# Patient Record
Sex: Female | Born: 1950
Health system: Southern US, Community
[De-identification: ages and names within clinical notes are randomized; demographics above are authoritative.]

## PROBLEM LIST (undated history)

## (undated) DIAGNOSIS — M1990 Unspecified osteoarthritis, unspecified site: Secondary | ICD-10-CM

## (undated) DIAGNOSIS — R195 Other fecal abnormalities: Secondary | ICD-10-CM

## (undated) DIAGNOSIS — E785 Hyperlipidemia, unspecified: Secondary | ICD-10-CM

## (undated) DIAGNOSIS — I10 Essential (primary) hypertension: Secondary | ICD-10-CM

## (undated) DIAGNOSIS — B019 Varicella without complication: Secondary | ICD-10-CM

## (undated) DIAGNOSIS — K625 Hemorrhage of anus and rectum: Secondary | ICD-10-CM

## (undated) DIAGNOSIS — R011 Cardiac murmur, unspecified: Secondary | ICD-10-CM

## (undated) HISTORY — DX: Unspecified osteoarthritis, unspecified site: M19.90

## (undated) HISTORY — DX: Hyperlipidemia, unspecified: E78.5

## (undated) HISTORY — DX: Essential (primary) hypertension: I10

## (undated) HISTORY — DX: Hemorrhage of anus and rectum: K62.5

## (undated) HISTORY — DX: Other fecal abnormalities: R19.5

## (undated) HISTORY — DX: Varicella without complication: B01.9

## (undated) HISTORY — DX: Cardiac murmur, unspecified: R01.1

## (undated) HISTORY — PX: TONSILLECTOMY AND ADENOIDECTOMY: SHX28

## (undated) HISTORY — PX: DG KNEE RIGHT COMPLETE (ARMC HX): HXRAD1559

## (undated) HISTORY — PX: BREAST SURGERY: SHX581

---

## 2015-10-07 LAB — BASIC METABOLIC PANEL
BUN: 12 mg/dL (ref 4–21)
CREATININE: 0.8 mg/dL (ref 0.5–1.1)
GLUCOSE: 150 mg/dL
POTASSIUM: 3.9 mmol/L (ref 3.4–5.3)
Sodium: 138 mmol/L (ref 137–147)

## 2015-10-07 LAB — CBC AND DIFFERENTIAL
HCT: 38 % (ref 36–46)
Hemoglobin: 11.8 g/dL — AB (ref 12.0–16.0)
PLATELETS: 314 10*3/uL (ref 150–399)
WBC: 13.5 10*3/mL

## 2015-10-07 LAB — LIPID PANEL
CHOLESTEROL: 201 mg/dL — AB (ref 0–200)
HDL: 79 mg/dL — AB (ref 35–70)
LDL Cholesterol: 102 mg/dL
Triglycerides: 98 mg/dL (ref 40–160)

## 2015-10-07 LAB — HEPATIC FUNCTION PANEL
ALK PHOS: 91 U/L (ref 25–125)
ALT: 12 U/L (ref 7–35)
AST: 10 U/L — AB (ref 13–35)
BILIRUBIN, TOTAL: 1 mg/dL

## 2015-10-07 LAB — HEMOGLOBIN A1C: HEMOGLOBIN A1C: 5.9

## 2015-10-07 LAB — TSH: TSH: 1.98 u[IU]/mL (ref 0.41–5.90)

## 2015-11-01 DIAGNOSIS — H25813 Combined forms of age-related cataract, bilateral: Secondary | ICD-10-CM | POA: Diagnosis not present

## 2015-11-01 DIAGNOSIS — H43311 Vitreous membranes and strands, right eye: Secondary | ICD-10-CM | POA: Diagnosis not present

## 2016-03-14 DIAGNOSIS — M9905 Segmental and somatic dysfunction of pelvic region: Secondary | ICD-10-CM | POA: Diagnosis not present

## 2016-03-14 DIAGNOSIS — M9903 Segmental and somatic dysfunction of lumbar region: Secondary | ICD-10-CM | POA: Diagnosis not present

## 2016-03-14 DIAGNOSIS — M9902 Segmental and somatic dysfunction of thoracic region: Secondary | ICD-10-CM | POA: Diagnosis not present

## 2016-03-14 DIAGNOSIS — M5136 Other intervertebral disc degeneration, lumbar region: Secondary | ICD-10-CM | POA: Diagnosis not present

## 2016-03-14 DIAGNOSIS — M5134 Other intervertebral disc degeneration, thoracic region: Secondary | ICD-10-CM | POA: Diagnosis not present

## 2016-03-15 DIAGNOSIS — M9905 Segmental and somatic dysfunction of pelvic region: Secondary | ICD-10-CM | POA: Diagnosis not present

## 2016-03-15 DIAGNOSIS — M9903 Segmental and somatic dysfunction of lumbar region: Secondary | ICD-10-CM | POA: Diagnosis not present

## 2016-03-15 DIAGNOSIS — M5136 Other intervertebral disc degeneration, lumbar region: Secondary | ICD-10-CM | POA: Diagnosis not present

## 2016-03-15 DIAGNOSIS — M9902 Segmental and somatic dysfunction of thoracic region: Secondary | ICD-10-CM | POA: Diagnosis not present

## 2016-03-15 DIAGNOSIS — M5134 Other intervertebral disc degeneration, thoracic region: Secondary | ICD-10-CM | POA: Diagnosis not present

## 2016-03-16 DIAGNOSIS — M9905 Segmental and somatic dysfunction of pelvic region: Secondary | ICD-10-CM | POA: Diagnosis not present

## 2016-03-16 DIAGNOSIS — M5134 Other intervertebral disc degeneration, thoracic region: Secondary | ICD-10-CM | POA: Diagnosis not present

## 2016-03-16 DIAGNOSIS — M9902 Segmental and somatic dysfunction of thoracic region: Secondary | ICD-10-CM | POA: Diagnosis not present

## 2016-03-16 DIAGNOSIS — M9903 Segmental and somatic dysfunction of lumbar region: Secondary | ICD-10-CM | POA: Diagnosis not present

## 2016-03-16 DIAGNOSIS — M5136 Other intervertebral disc degeneration, lumbar region: Secondary | ICD-10-CM | POA: Diagnosis not present

## 2016-03-20 DIAGNOSIS — M9903 Segmental and somatic dysfunction of lumbar region: Secondary | ICD-10-CM | POA: Diagnosis not present

## 2016-03-20 DIAGNOSIS — M9902 Segmental and somatic dysfunction of thoracic region: Secondary | ICD-10-CM | POA: Diagnosis not present

## 2016-03-20 DIAGNOSIS — M9905 Segmental and somatic dysfunction of pelvic region: Secondary | ICD-10-CM | POA: Diagnosis not present

## 2016-03-20 DIAGNOSIS — M5134 Other intervertebral disc degeneration, thoracic region: Secondary | ICD-10-CM | POA: Diagnosis not present

## 2016-03-20 DIAGNOSIS — M5136 Other intervertebral disc degeneration, lumbar region: Secondary | ICD-10-CM | POA: Diagnosis not present

## 2016-03-21 DIAGNOSIS — M9902 Segmental and somatic dysfunction of thoracic region: Secondary | ICD-10-CM | POA: Diagnosis not present

## 2016-03-21 DIAGNOSIS — M5134 Other intervertebral disc degeneration, thoracic region: Secondary | ICD-10-CM | POA: Diagnosis not present

## 2016-03-21 DIAGNOSIS — M5136 Other intervertebral disc degeneration, lumbar region: Secondary | ICD-10-CM | POA: Diagnosis not present

## 2016-03-21 DIAGNOSIS — M9905 Segmental and somatic dysfunction of pelvic region: Secondary | ICD-10-CM | POA: Diagnosis not present

## 2016-03-21 DIAGNOSIS — M9903 Segmental and somatic dysfunction of lumbar region: Secondary | ICD-10-CM | POA: Diagnosis not present

## 2016-03-23 DIAGNOSIS — M9905 Segmental and somatic dysfunction of pelvic region: Secondary | ICD-10-CM | POA: Diagnosis not present

## 2016-03-23 DIAGNOSIS — M9903 Segmental and somatic dysfunction of lumbar region: Secondary | ICD-10-CM | POA: Diagnosis not present

## 2016-03-23 DIAGNOSIS — M5136 Other intervertebral disc degeneration, lumbar region: Secondary | ICD-10-CM | POA: Diagnosis not present

## 2016-03-23 DIAGNOSIS — M5134 Other intervertebral disc degeneration, thoracic region: Secondary | ICD-10-CM | POA: Diagnosis not present

## 2016-03-23 DIAGNOSIS — M9902 Segmental and somatic dysfunction of thoracic region: Secondary | ICD-10-CM | POA: Diagnosis not present

## 2016-03-27 DIAGNOSIS — M9905 Segmental and somatic dysfunction of pelvic region: Secondary | ICD-10-CM | POA: Diagnosis not present

## 2016-03-27 DIAGNOSIS — M9902 Segmental and somatic dysfunction of thoracic region: Secondary | ICD-10-CM | POA: Diagnosis not present

## 2016-03-27 DIAGNOSIS — M5136 Other intervertebral disc degeneration, lumbar region: Secondary | ICD-10-CM | POA: Diagnosis not present

## 2016-03-27 DIAGNOSIS — M5134 Other intervertebral disc degeneration, thoracic region: Secondary | ICD-10-CM | POA: Diagnosis not present

## 2016-03-27 DIAGNOSIS — M9903 Segmental and somatic dysfunction of lumbar region: Secondary | ICD-10-CM | POA: Diagnosis not present

## 2016-03-28 DIAGNOSIS — M5134 Other intervertebral disc degeneration, thoracic region: Secondary | ICD-10-CM | POA: Diagnosis not present

## 2016-03-28 DIAGNOSIS — M9905 Segmental and somatic dysfunction of pelvic region: Secondary | ICD-10-CM | POA: Diagnosis not present

## 2016-03-28 DIAGNOSIS — M5136 Other intervertebral disc degeneration, lumbar region: Secondary | ICD-10-CM | POA: Diagnosis not present

## 2016-03-28 DIAGNOSIS — M9903 Segmental and somatic dysfunction of lumbar region: Secondary | ICD-10-CM | POA: Diagnosis not present

## 2016-03-28 DIAGNOSIS — M9902 Segmental and somatic dysfunction of thoracic region: Secondary | ICD-10-CM | POA: Diagnosis not present

## 2016-03-30 DIAGNOSIS — M5134 Other intervertebral disc degeneration, thoracic region: Secondary | ICD-10-CM | POA: Diagnosis not present

## 2016-03-30 DIAGNOSIS — M9903 Segmental and somatic dysfunction of lumbar region: Secondary | ICD-10-CM | POA: Diagnosis not present

## 2016-03-30 DIAGNOSIS — M9902 Segmental and somatic dysfunction of thoracic region: Secondary | ICD-10-CM | POA: Diagnosis not present

## 2016-03-30 DIAGNOSIS — M5136 Other intervertebral disc degeneration, lumbar region: Secondary | ICD-10-CM | POA: Diagnosis not present

## 2016-03-30 DIAGNOSIS — M9905 Segmental and somatic dysfunction of pelvic region: Secondary | ICD-10-CM | POA: Diagnosis not present

## 2016-04-03 DIAGNOSIS — M9902 Segmental and somatic dysfunction of thoracic region: Secondary | ICD-10-CM | POA: Diagnosis not present

## 2016-04-03 DIAGNOSIS — M9905 Segmental and somatic dysfunction of pelvic region: Secondary | ICD-10-CM | POA: Diagnosis not present

## 2016-04-03 DIAGNOSIS — M5134 Other intervertebral disc degeneration, thoracic region: Secondary | ICD-10-CM | POA: Diagnosis not present

## 2016-04-03 DIAGNOSIS — M5136 Other intervertebral disc degeneration, lumbar region: Secondary | ICD-10-CM | POA: Diagnosis not present

## 2016-04-03 DIAGNOSIS — M9903 Segmental and somatic dysfunction of lumbar region: Secondary | ICD-10-CM | POA: Diagnosis not present

## 2016-04-04 DIAGNOSIS — M9902 Segmental and somatic dysfunction of thoracic region: Secondary | ICD-10-CM | POA: Diagnosis not present

## 2016-04-04 DIAGNOSIS — M9903 Segmental and somatic dysfunction of lumbar region: Secondary | ICD-10-CM | POA: Diagnosis not present

## 2016-04-04 DIAGNOSIS — M9905 Segmental and somatic dysfunction of pelvic region: Secondary | ICD-10-CM | POA: Diagnosis not present

## 2016-04-04 DIAGNOSIS — M5134 Other intervertebral disc degeneration, thoracic region: Secondary | ICD-10-CM | POA: Diagnosis not present

## 2016-04-04 DIAGNOSIS — M5136 Other intervertebral disc degeneration, lumbar region: Secondary | ICD-10-CM | POA: Diagnosis not present

## 2016-04-06 DIAGNOSIS — M5136 Other intervertebral disc degeneration, lumbar region: Secondary | ICD-10-CM | POA: Diagnosis not present

## 2016-04-06 DIAGNOSIS — M9903 Segmental and somatic dysfunction of lumbar region: Secondary | ICD-10-CM | POA: Diagnosis not present

## 2016-04-06 DIAGNOSIS — M9905 Segmental and somatic dysfunction of pelvic region: Secondary | ICD-10-CM | POA: Diagnosis not present

## 2016-04-06 DIAGNOSIS — M9902 Segmental and somatic dysfunction of thoracic region: Secondary | ICD-10-CM | POA: Diagnosis not present

## 2016-04-06 DIAGNOSIS — M5134 Other intervertebral disc degeneration, thoracic region: Secondary | ICD-10-CM | POA: Diagnosis not present

## 2016-04-11 DIAGNOSIS — M5136 Other intervertebral disc degeneration, lumbar region: Secondary | ICD-10-CM | POA: Diagnosis not present

## 2016-04-11 DIAGNOSIS — M9903 Segmental and somatic dysfunction of lumbar region: Secondary | ICD-10-CM | POA: Diagnosis not present

## 2016-04-11 DIAGNOSIS — M9905 Segmental and somatic dysfunction of pelvic region: Secondary | ICD-10-CM | POA: Diagnosis not present

## 2016-04-11 DIAGNOSIS — M5134 Other intervertebral disc degeneration, thoracic region: Secondary | ICD-10-CM | POA: Diagnosis not present

## 2016-04-11 DIAGNOSIS — M9902 Segmental and somatic dysfunction of thoracic region: Secondary | ICD-10-CM | POA: Diagnosis not present

## 2016-04-13 DIAGNOSIS — M5134 Other intervertebral disc degeneration, thoracic region: Secondary | ICD-10-CM | POA: Diagnosis not present

## 2016-04-13 DIAGNOSIS — M5136 Other intervertebral disc degeneration, lumbar region: Secondary | ICD-10-CM | POA: Diagnosis not present

## 2016-04-13 DIAGNOSIS — M9902 Segmental and somatic dysfunction of thoracic region: Secondary | ICD-10-CM | POA: Diagnosis not present

## 2016-04-13 DIAGNOSIS — M9903 Segmental and somatic dysfunction of lumbar region: Secondary | ICD-10-CM | POA: Diagnosis not present

## 2016-04-13 DIAGNOSIS — M9905 Segmental and somatic dysfunction of pelvic region: Secondary | ICD-10-CM | POA: Diagnosis not present

## 2016-04-19 DIAGNOSIS — M9902 Segmental and somatic dysfunction of thoracic region: Secondary | ICD-10-CM | POA: Diagnosis not present

## 2016-04-19 DIAGNOSIS — M5136 Other intervertebral disc degeneration, lumbar region: Secondary | ICD-10-CM | POA: Diagnosis not present

## 2016-04-19 DIAGNOSIS — M9905 Segmental and somatic dysfunction of pelvic region: Secondary | ICD-10-CM | POA: Diagnosis not present

## 2016-04-19 DIAGNOSIS — M5134 Other intervertebral disc degeneration, thoracic region: Secondary | ICD-10-CM | POA: Diagnosis not present

## 2016-04-19 DIAGNOSIS — M9903 Segmental and somatic dysfunction of lumbar region: Secondary | ICD-10-CM | POA: Diagnosis not present

## 2016-04-26 DIAGNOSIS — M5134 Other intervertebral disc degeneration, thoracic region: Secondary | ICD-10-CM | POA: Diagnosis not present

## 2016-04-26 DIAGNOSIS — M9903 Segmental and somatic dysfunction of lumbar region: Secondary | ICD-10-CM | POA: Diagnosis not present

## 2016-04-26 DIAGNOSIS — M9902 Segmental and somatic dysfunction of thoracic region: Secondary | ICD-10-CM | POA: Diagnosis not present

## 2016-04-26 DIAGNOSIS — M5136 Other intervertebral disc degeneration, lumbar region: Secondary | ICD-10-CM | POA: Diagnosis not present

## 2016-04-26 DIAGNOSIS — M9905 Segmental and somatic dysfunction of pelvic region: Secondary | ICD-10-CM | POA: Diagnosis not present

## 2016-05-02 ENCOUNTER — Ambulatory Visit (INDEPENDENT_AMBULATORY_CARE_PROVIDER_SITE_OTHER): Payer: Medicare Other | Admitting: Family Medicine

## 2016-05-02 ENCOUNTER — Encounter: Payer: Self-pay | Admitting: Family Medicine

## 2016-05-02 VITALS — BP 140/80 | HR 69 | Resp 12 | Ht 62.5 in | Wt 289.1 lb

## 2016-05-02 DIAGNOSIS — R739 Hyperglycemia, unspecified: Secondary | ICD-10-CM | POA: Diagnosis not present

## 2016-05-02 DIAGNOSIS — I10 Essential (primary) hypertension: Secondary | ICD-10-CM

## 2016-05-02 DIAGNOSIS — Z23 Encounter for immunization: Secondary | ICD-10-CM | POA: Diagnosis not present

## 2016-05-02 DIAGNOSIS — G4733 Obstructive sleep apnea (adult) (pediatric): Secondary | ICD-10-CM | POA: Diagnosis not present

## 2016-05-02 DIAGNOSIS — M170 Bilateral primary osteoarthritis of knee: Secondary | ICD-10-CM

## 2016-05-02 DIAGNOSIS — Z6841 Body Mass Index (BMI) 40.0 and over, adult: Secondary | ICD-10-CM

## 2016-05-02 HISTORY — DX: Essential (primary) hypertension: I10

## 2016-05-02 HISTORY — DX: Obstructive sleep apnea (adult) (pediatric): G47.33

## 2016-05-02 HISTORY — DX: Morbid (severe) obesity due to excess calories: E66.01

## 2016-05-02 HISTORY — DX: Body Mass Index (BMI) 40.0 and over, adult: Z684

## 2016-05-02 HISTORY — DX: Bilateral primary osteoarthritis of knee: M17.0

## 2016-05-02 LAB — BASIC METABOLIC PANEL
BUN: 15 mg/dL (ref 6–23)
CHLORIDE: 102 meq/L (ref 96–112)
CO2: 32 meq/L (ref 19–32)
Calcium: 9.3 mg/dL (ref 8.4–10.5)
Creatinine, Ser: 0.85 mg/dL (ref 0.40–1.20)
GFR: 86.2 mL/min (ref >60.00)
GLUCOSE: 120 mg/dL — AB (ref 70–99)
Potassium: 3.6 mEq/L (ref 3.5–5.1)
SODIUM: 140 meq/L (ref 135–145)

## 2016-05-02 LAB — HEMOGLOBIN A1C: HEMOGLOBIN A1C: 5.6 % (ref 4.6–6.5)

## 2016-05-02 MED ORDER — METOPROLOL SUCCINATE ER 25 MG PO TB24: 25.0000 mg | ORAL_TABLET | Freq: Every day | ORAL | 0 refills | Status: DC

## 2016-05-02 NOTE — Progress Notes (Signed)
Pre visit review using our clinic review tool, if applicable. No additional management support is needed unless otherwise documented below in the visit note. 

## 2016-05-02 NOTE — Patient Instructions (Addendum)
A few things to remember from today's visit:   Hyperglycemia - Plan: Hemoglobin A1c, Fructosamine, Basic Metabolic Panel  BMI 99991111, adult (HCC)  OSA (obstructive sleep apnea)  Essential hypertension, benign - Plan: Basic Metabolic Panel, metoprolol succinate (TOPROL-XL) 25 MG 24 hr tablet  Bilateral primary osteoarthritis of knee  Reconsider quitting CPAP at night.  Weight Watchers is a good option for a healthy diet. Continue low impact exercise.   Blood pressure goal for most people is less than 140/90.Some populations (older than 60) the goal is less than 150/90.  Elevated blood pressure increases the risk of strokes, heart and kidney disease, and eye problems. Regular physical activity and a healthy diet (DASH diet) usually help. Low salt diet. Take medications as instructed. Caution with some over the counter medications as cold medications, dietary products (for weight loss), and Ibuprofen or Aleve (frequent use), as well as Mobic;all these medications could cause elevation of blood pressure.   Avoid sugar added food:regular soft drinks, energy drinks, and sports drinks. candy. cakes. cookies. pies and cobblers. sweet rolls, pastries, and donuts. fruit drinks, such as fruitades and fruit punch. dairy desserts, such as ice cream  Mediterranean diet has showed benefits for sugar control.  How much and what type of carbohydrate foods are important for managing diabetes. The balance between how much insulin is in your body and the carbohydrate you eat makes a difference in your blood glucose levels.     Remember checking feet periodically, good dental hygiene, and annual eye exam.    Medicare covers a annual preventive visit, which is strongly recommended , it is once per year and involves a series of questions to identify risk factors; so we can try to prevent possible complications. This does not need to be done by a doctor.  We have a nurse Investment banker, corporate) here that is  highly qualified to do it, it can be arrange same date you have a follow up appointment with me or labs scheduled, and it 100% covered by Medicare. So before you leave today I would like for you to arrange visit with Ms Tina Villanueva in 10/2016 for Medicare wellness visit.   Please be sure medication list is accurate. If a new problem present, please set up appointment sooner than planned today.

## 2016-05-02 NOTE — Progress Notes (Signed)
HPI:   Tina Villanueva is a 65 y.o. female, who is here today to establish care with me.  Former PCP: Dr Fransisco Hertz. Last preventive routine visit: 09/2015  Hx of knee OA, s/p right TKR.  HTN,HLD, OSA, and pre-diabetes.   Concerns today: Atenolol   Hypertension:   Currently on Nifedipine ER 90 mg daily, Diovan HCT  160-25 mg, and Atenolol 25 mg daily. According to patient, she usually has trouble feeling the Atenolol because of shortness of the medication, she would like to change it to a different one.  BP readings at home usually < 140/90.   She is taking medications as instructed, no side effects reported.  She has not noted unusual headache, visual changes, exertional chest pain, dyspnea,  focal weakness, or edema.   Lab Results  Component Value Date   CREATININE 0.8 10/07/2015   BUN 12 10/07/2015   NA 138 10/07/2015   K 3.9 10/07/2015     Hyperlipidemia:  Currently on Zocor  Following a low fat diet: Yes  She has not noted side effects with medication.  Lab Results  Component Value Date   CHOL 201 (A) 10/07/2015   HDL 79 (A) 10/07/2015   LDLCALC 102 10/07/2015   TRIG 98 10/07/2015     She exercises regularly, exercises a few times per week. She tries to follow a healthy diet.  Hx of OSA, diagnosed a few years ago. She is not wearing CPAP because she cannot tolerate mask.  History of knee osteoarthritis, currently she is on Mobic 15 mg daily. Medication helps with pain, pain is exacerbated by certain activities like prolonged walking, going up or down stairs, kneeling.  Alleviated by rest.  She is also reporting history of prediabetes, currently she is on metformin 500 mg twice daily, which she has taken for 3-4 years. Tingling sensation on foot, bilateral, for many years and stable.  Currently she is living with her daughter, waiting for her house to be built. Eventually she is planning on moving to her place to live by  herself. Independent ADL's and IADL's.    Review of Systems  Constitutional: Negative for activity change, appetite change, fatigue, fever and unexpected weight change.  HENT: Negative for dental problem, mouth sores, nosebleeds and trouble swallowing.   Eyes: Negative for redness and visual disturbance.  Respiratory: Negative for cough, shortness of breath and wheezing.   Cardiovascular: Negative for chest pain, palpitations and leg swelling.  Gastrointestinal: Negative for abdominal pain, nausea and vomiting.       Negative for changes in bowel habits.  Endocrine: Negative for polydipsia, polyphagia and polyuria.  Genitourinary: Negative for decreased urine volume, difficulty urinating, dysuria and hematuria.  Musculoskeletal: Positive for arthralgias. Negative for gait problem and myalgias.  Skin: Negative for rash and wound.  Neurological: Negative for seizures, syncope, weakness and headaches.  Psychiatric/Behavioral: Negative for confusion and sleep disturbance. The patient is not nervous/anxious.       No current outpatient prescriptions on file prior to visit.   No current facility-administered medications on file prior to visit.      Past Medical History:  Diagnosis Date  . Arthritis   . Chicken pox   . Hyperlipidemia   . Hypertension    Not on File  Family History  Problem Relation Age of Onset  . Arthritis Mother   . Hyperlipidemia Mother   . Hypertension Mother   . Stroke Mother   . Arthritis Father   .  Hyperlipidemia Father   . Hypertension Father   . Stroke Father     Social History   Social History  . Marital status: Unknown    Spouse name: N/A  . Number of children: N/A  . Years of education: N/A   Social History Main Topics  . Smoking status: Former Research scientist (life sciences)  . Smokeless tobacco: None  . Alcohol use None  . Drug use: Unknown  . Sexual activity: Not Asked   Other Topics Concern  . None   Social History Narrative  . None    Vitals:    05/02/16 1356  BP: 140/80  Pulse: 69   O2 sat at RA 97%.  Body mass index is 52.04 kg/m.     Physical Exam  Nursing note and vitals reviewed. Constitutional: She is oriented to person, place, and time. She appears well-developed. No distress.  HENT:  Head: Atraumatic.  Mouth/Throat: Oropharynx is clear and moist and mucous membranes are normal.  Eyes: Conjunctivae and EOM are normal. Pupils are equal, round, and reactive to light.  Neck: No thyroid mass and no thyromegaly present.  Cardiovascular: Normal rate and regular rhythm.   Murmur (soft SEM RUSB) heard. Pulses:      Dorsalis pedis pulses are 2+ on the right side, and 2+ on the left side.  Respiratory: Effort normal and breath sounds normal. No respiratory distress.  GI: Soft. She exhibits no mass. There is no hepatomegaly. There is no tenderness.  Musculoskeletal: She exhibits edema (Pitting trace LE edema bilateral). She exhibits no tenderness.  Knee crepitus bilateral, no pain elicited with ROM, mild limitation of flexion bilateral.  Lymphadenopathy:    She has no cervical adenopathy.  Neurological: She is alert and oriented to person, place, and time. She has normal strength. Coordination normal.  Stable gait with no assistance needed  Skin: Skin is warm. No erythema.  Psychiatric: She has a normal mood and affect.  Well groomed, good eye contact.      ASSESSMENT AND PLAN:     [name] was seen today for new patient (initial visit).  Diagnoses and all orders for this visit:  Hyperglycemia  IFG. No changes in current management. Further recommendations will be given according to lab results.  -     Hemoglobin A1c -     Fructosamine -     Basic Metabolic Panel  BMI 99991111, adult (HCC)  We discussed benefits of wt loss as well as adverse effects of obesity. Consistency with healthy diet and physical activity recommended. Weight Watchers is a good option.   OSA (obstructive sleep apnea)  We  discussed some adverse effects of not treated OSA. Weight loss might help, recommend reconsidering wearing CPAP.  Essential hypertension, benign  Adequately controlled. Atenolol changed to Metoprolol Succinate. Rest meds unchanged. DASH-low salt diet recommended. Eye exam recommended annually. F/U in 4 months, before if needed.  -     Basic Metabolic Panel -     metoprolol succinate (TOPROL-XL) 25 MG 24 hr tablet; Take 1 tablet (25 mg total) by mouth daily.  Bilateral primary osteoarthritis of knee  Stable. Some side effects of NSAIDs discussed. We will continue monitoring renal function and BP.     -We discussed some risk of interactions with some of her medications, particularly Zocor and Nifedipine. For now no changes on these medications, next office visit we will recheck FLP we might consider changing Zocor for other statin medication. -Prevnar given today.        Malka So  [place], [place]. [place] office.

## 2016-05-03 DIAGNOSIS — M5136 Other intervertebral disc degeneration, lumbar region: Secondary | ICD-10-CM | POA: Diagnosis not present

## 2016-05-03 DIAGNOSIS — M9903 Segmental and somatic dysfunction of lumbar region: Secondary | ICD-10-CM | POA: Diagnosis not present

## 2016-05-03 DIAGNOSIS — M9905 Segmental and somatic dysfunction of pelvic region: Secondary | ICD-10-CM | POA: Diagnosis not present

## 2016-05-03 DIAGNOSIS — M9902 Segmental and somatic dysfunction of thoracic region: Secondary | ICD-10-CM | POA: Diagnosis not present

## 2016-05-03 DIAGNOSIS — M5134 Other intervertebral disc degeneration, thoracic region: Secondary | ICD-10-CM | POA: Diagnosis not present

## 2016-05-05 LAB — FRUCTOSAMINE: Fructosamine: 219 umol/L (ref 190–270)

## 2016-05-17 DIAGNOSIS — M9903 Segmental and somatic dysfunction of lumbar region: Secondary | ICD-10-CM | POA: Diagnosis not present

## 2016-05-17 DIAGNOSIS — M5136 Other intervertebral disc degeneration, lumbar region: Secondary | ICD-10-CM | POA: Diagnosis not present

## 2016-05-17 DIAGNOSIS — M5134 Other intervertebral disc degeneration, thoracic region: Secondary | ICD-10-CM | POA: Diagnosis not present

## 2016-05-17 DIAGNOSIS — M9905 Segmental and somatic dysfunction of pelvic region: Secondary | ICD-10-CM | POA: Diagnosis not present

## 2016-05-17 DIAGNOSIS — M9902 Segmental and somatic dysfunction of thoracic region: Secondary | ICD-10-CM | POA: Diagnosis not present

## 2016-06-14 DIAGNOSIS — M5134 Other intervertebral disc degeneration, thoracic region: Secondary | ICD-10-CM | POA: Diagnosis not present

## 2016-06-14 DIAGNOSIS — M9902 Segmental and somatic dysfunction of thoracic region: Secondary | ICD-10-CM | POA: Diagnosis not present

## 2016-06-14 DIAGNOSIS — M9903 Segmental and somatic dysfunction of lumbar region: Secondary | ICD-10-CM | POA: Diagnosis not present

## 2016-06-14 DIAGNOSIS — M9905 Segmental and somatic dysfunction of pelvic region: Secondary | ICD-10-CM | POA: Diagnosis not present

## 2016-06-14 DIAGNOSIS — M5136 Other intervertebral disc degeneration, lumbar region: Secondary | ICD-10-CM | POA: Diagnosis not present

## 2016-07-11 ENCOUNTER — Ambulatory Visit: Payer: Medicare Other | Admitting: Internal Medicine

## 2016-07-12 DIAGNOSIS — M9902 Segmental and somatic dysfunction of thoracic region: Secondary | ICD-10-CM | POA: Diagnosis not present

## 2016-07-12 DIAGNOSIS — M9905 Segmental and somatic dysfunction of pelvic region: Secondary | ICD-10-CM | POA: Diagnosis not present

## 2016-07-12 DIAGNOSIS — M5136 Other intervertebral disc degeneration, lumbar region: Secondary | ICD-10-CM | POA: Diagnosis not present

## 2016-07-12 DIAGNOSIS — M9903 Segmental and somatic dysfunction of lumbar region: Secondary | ICD-10-CM | POA: Diagnosis not present

## 2016-07-12 DIAGNOSIS — M5134 Other intervertebral disc degeneration, thoracic region: Secondary | ICD-10-CM | POA: Diagnosis not present

## 2016-08-09 DIAGNOSIS — M9905 Segmental and somatic dysfunction of pelvic region: Secondary | ICD-10-CM | POA: Diagnosis not present

## 2016-08-09 DIAGNOSIS — M5136 Other intervertebral disc degeneration, lumbar region: Secondary | ICD-10-CM | POA: Diagnosis not present

## 2016-08-09 DIAGNOSIS — M5134 Other intervertebral disc degeneration, thoracic region: Secondary | ICD-10-CM | POA: Diagnosis not present

## 2016-08-09 DIAGNOSIS — M9903 Segmental and somatic dysfunction of lumbar region: Secondary | ICD-10-CM | POA: Diagnosis not present

## 2016-08-09 DIAGNOSIS — M9902 Segmental and somatic dysfunction of thoracic region: Secondary | ICD-10-CM | POA: Diagnosis not present

## 2016-08-14 ENCOUNTER — Other Ambulatory Visit: Payer: Self-pay | Admitting: Family Medicine

## 2016-08-14 DIAGNOSIS — I10 Essential (primary) hypertension: Secondary | ICD-10-CM

## 2016-09-05 ENCOUNTER — Ambulatory Visit (INDEPENDENT_AMBULATORY_CARE_PROVIDER_SITE_OTHER): Payer: Medicare Other | Admitting: Family Medicine

## 2016-09-05 ENCOUNTER — Encounter: Payer: Self-pay | Admitting: Family Medicine

## 2016-09-05 VITALS — BP 132/80 | HR 71 | Resp 12 | Ht 62.5 in | Wt 283.1 lb

## 2016-09-05 DIAGNOSIS — I10 Essential (primary) hypertension: Secondary | ICD-10-CM | POA: Diagnosis not present

## 2016-09-05 DIAGNOSIS — Z6841 Body Mass Index (BMI) 40.0 and over, adult: Secondary | ICD-10-CM | POA: Diagnosis not present

## 2016-09-05 DIAGNOSIS — E785 Hyperlipidemia, unspecified: Secondary | ICD-10-CM

## 2016-09-05 DIAGNOSIS — M170 Bilateral primary osteoarthritis of knee: Secondary | ICD-10-CM | POA: Diagnosis not present

## 2016-09-05 DIAGNOSIS — R7301 Impaired fasting glucose: Secondary | ICD-10-CM | POA: Diagnosis not present

## 2016-09-05 HISTORY — DX: Impaired fasting glucose: R73.01

## 2016-09-05 LAB — COMPREHENSIVE METABOLIC PANEL
ALBUMIN: 4.2 g/dL (ref 3.5–5.2)
ALT: 13 U/L (ref 0–35)
AST: 11 U/L (ref 0–37)
Alkaline Phosphatase: 90 U/L (ref 39–117)
BILIRUBIN TOTAL: 0.6 mg/dL (ref 0.2–1.2)
BUN: 16 mg/dL (ref 6–23)
CALCIUM: 9.4 mg/dL (ref 8.4–10.5)
CHLORIDE: 101 meq/L (ref 96–112)
CO2: 30 mEq/L (ref 19–32)
CREATININE: 0.69 mg/dL (ref 0.40–1.20)
GFR: 109.54 mL/min (ref >60.00)
Glucose, Bld: 89 mg/dL (ref 70–99)
Potassium: 4 mEq/L (ref 3.5–5.1)
Sodium: 140 mEq/L (ref 135–145)
Total Protein: 6.8 g/dL (ref 6.0–8.3)

## 2016-09-05 LAB — LIPID PANEL
CHOLESTEROL: 218 mg/dL — AB (ref 0–200)
HDL: 64.1 mg/dL (ref >39.00)
LDL Cholesterol: 126 mg/dL — ABNORMAL HIGH (ref 0–99)
NonHDL: 153.56
TRIGLYCERIDES: 136 mg/dL (ref 0.0–149.0)
Total CHOL/HDL Ratio: 3
VLDL: 27.2 mg/dL (ref 0.0–40.0)

## 2016-09-05 LAB — HEMOGLOBIN A1C: Hgb A1c MFr Bld: 5.6 % (ref 4.6–6.5)

## 2016-09-05 MED ORDER — NIFEDIPINE ER OSMOTIC RELEASE 90 MG PO TB24: 90.0000 mg | ORAL_TABLET | Freq: Every day | ORAL | 2 refills | Status: DC

## 2016-09-05 MED ORDER — MELOXICAM 15 MG PO TABS: 15.0000 mg | ORAL_TABLET | ORAL | 1 refills | Status: DC | PRN

## 2016-09-05 MED ORDER — METOPROLOL SUCCINATE ER 25 MG PO TB24: 25.0000 mg | ORAL_TABLET | Freq: Every day | ORAL | 1 refills | Status: DC

## 2016-09-05 NOTE — Progress Notes (Signed)
HPI:   Ms.Subrena Kohtz is a 66 y.o. female, who is here today to follow on some of her chronic medical problems.  Last seen 05/02/16.    Hypertension:  Currently on Valsartan-HCTZ  160-25 mg, Metoprolol Succinate 25 mg daily, and Procardia XL 90 mg daily.  She has not noted unusual headache, visual changes, exertional chest pain, dyspnea,  focal weakness, or edema.   Lab Results  Component Value Date   CREATININE 0.85 05/02/2016   BUN 15 05/02/2016   NA 140 05/02/2016   K 3.6 05/02/2016   CL 102 05/02/2016   CO2 32 05/02/2016   Hx of CPAP, she does not wear her CPAP.   IFG: She is on Metformin 1000 mg daily. She is still following a healthy diet and exercising regularly, water aerobic for OA.  HLD: She is currently on Zocor 40 mg daily. Tolerating well, no side effects reported. She follows low fat diet.  Knee OA: Mainly left knee pain. Mobic 15 mg daily as needed helping with pain. Pain is exacerbated by movement, going up and down stairs, prolonged standing/walking,and cold weather. Alleviated by rest. + Stiffness.     Review of Systems  Constitutional: Negative for activity change, fatigue, fever and unexpected weight change.  HENT: Negative for mouth sores, nosebleeds and trouble swallowing.   Eyes: Negative for pain, redness and visual disturbance.  Respiratory: Negative for cough, shortness of breath and wheezing.   Cardiovascular: Negative for chest pain, palpitations and leg swelling.  Gastrointestinal: Negative for abdominal pain, nausea and vomiting.       Negative for changes in bowel habits.  Endocrine: Negative for polydipsia, polyphagia and polyuria.  Genitourinary: Negative for decreased urine volume, difficulty urinating and hematuria.  Musculoskeletal: Positive for arthralgias. Negative for joint swelling.  Skin: Negative for rash.  Neurological: Negative for syncope, weakness and headaches.  Psychiatric/Behavioral: Negative for  confusion. The patient is not nervous/anxious.       Current Outpatient Prescriptions on File Prior to Visit  Medication Sig Dispense Refill  . metFORMIN (GLUCOPHAGE) 500 MG tablet Take by mouth 2 (two) times daily with a meal.    . simvastatin (ZOCOR) 40 MG tablet Take 40 mg by mouth daily.    . valsartan-hydrochlorothiazide (DIOVAN-HCT) 160-25 MG tablet Take 1 tablet by mouth daily.     No current facility-administered medications on file prior to visit.      Past Medical History:  Diagnosis Date  . Arthritis   . Chicken pox   . Hyperlipidemia   . Hypertension    Not on File  Social History   Social History  . Marital status: Unknown    Spouse name: N/A  . Number of children: N/A  . Years of education: N/A   Social History Main Topics  . Smoking status: Former Research scientist (life sciences)  . Smokeless tobacco: None  . Alcohol use None  . Drug use: Unknown  . Sexual activity: Not Asked   Other Topics Concern  . None   Social History Narrative  . None    Vitals:   09/05/16 1418  BP: 132/80  Pulse: 71  Resp: 12   Body mass index is 50.96 kg/m.  Wt Readings from Last 3 Encounters:  09/05/16 283 lb 2 oz (128.4 kg)  05/02/16 289 lb 2 oz (131.1 kg)       Physical Exam  Nursing note and vitals reviewed. Constitutional: She is oriented to person, place, and time. She appears well-developed. No distress.  HENT:  Head: Atraumatic.  Mouth/Throat: Oropharynx is clear and moist and mucous membranes are normal.  Eyes: Conjunctivae and EOM are normal. Pupils are equal, round, and reactive to light.  Cardiovascular: Normal rate and regular rhythm.   Murmur (soft SEM LUSB) heard. Pulses:      Dorsalis pedis pulses are 2+ on the right side, and 2+ on the left side.  Respiratory: Effort normal and breath sounds normal. No respiratory distress.  GI: Soft. She exhibits no mass. There is no hepatomegaly. There is no tenderness.  Musculoskeletal: She exhibits no edema or tenderness.    Left knee crepitus and mild limitation flexion. No pain elicited.  Neurological: She is alert and oriented to person, place, and time. She has normal strength. Coordination normal.  Stable gait with no assistance.  Skin: Skin is warm. No erythema.  Psychiatric: She has a normal mood and affect.  Well groomed, good eye contact.      ASSESSMENT AND PLAN:     Kilian was seen today for follow-up.  Diagnoses and all orders for this visit:    Lab Results  Component Value Date   HGBA1C 5.6 09/05/2016      Chemistry      Component Value Date/Time   NA 140 09/05/2016 1454   NA 138 10/07/2015   K 4.0 09/05/2016 1454   CL 101 09/05/2016 1454   CO2 30 09/05/2016 1454   BUN 16 09/05/2016 1454   BUN 12 10/07/2015   CREATININE 0.69 09/05/2016 1454   GLU 150 10/07/2015      Component Value Date/Time   CALCIUM 9.4 09/05/2016 1454   ALKPHOS 90 09/05/2016 1454   AST 11 09/05/2016 1454   ALT 13 09/05/2016 1454   BILITOT 0.6 09/05/2016 1454     Lab Results  Component Value Date   CHOL 218 (H) 09/05/2016   HDL 64.10 09/05/2016   LDLCALC 126 (H) 09/05/2016   TRIG 136.0 09/05/2016   CHOLHDL 3 09/05/2016    Bilateral primary osteoarthritis of knee  Stable. No changes in current management, some side effects of chronic NSAID's intake discussed. Continue low impact exercise and working on wt loss. F/U in 6 months.  -     meloxicam (MOBIC) 15 MG tablet; Take 1 tablet (15 mg total) by mouth as needed for pain.  BMI 50.0-59.9, adult [facility])  She has lost about 6 pound sine her last OV. We discussed benefits of wt loss as well as adverse effects of obesity. Consistency with healthy diet and physical activity recommended.   Essential hypertension, benign  Adequately controlled. No changes in current management. DASH-low salt diet recommended. Eye exam recommended annually. F/U in 6 months, before if needed.   -     Comprehensive metabolic panel -     metoprolol  succinate (TOPROL-XL) 25 MG 24 hr tablet; Take 1 tablet (25 mg total) by mouth daily. -     NIFEdipine (PROCARDIA XL/ADALAT-CC) 90 MG 24 hr tablet; Take 1 tablet (90 mg total) by mouth daily.  Hyperlipidemia, unspecified hyperlipidemia type  No changes in current management, will follow labs done today and will give further recommendations accordingly. Will consider changing to Lipitor or decreasing Zocor dose.  -     Lipid panel  IFG (impaired fasting glucose)  No changes in current management, will follow labs done today and will give further recommendations accordingly. Continue healthy life style.   -     Hemoglobin A1c       -Ms. Analysse  Deperalta was advised to return sooner than planned today if new concerns arise.       Betty G. [place], MD  [facility]. [place] office.

## 2016-09-05 NOTE — Patient Instructions (Addendum)
A few things to remember from today's visit:   Bilateral primary osteoarthritis of knee  BMI 50.0-59.9, adult (HCC)  Essential hypertension, benign - Plan: Comprehensive metabolic panel, metoprolol succinate (TOPROL-XL) 25 MG 24 hr tablet, NIFEdipine (PROCARDIA XL/ADALAT-CC) 90 MG 24 hr tablet  Hyperlipidemia, unspecified hyperlipidemia type - Plan: Lipid panel  IFG (impaired fasting glucose) - Plan: Hemoglobin A1c    Medicare covers a annual preventive visit, which is strongly recommended , it is once per year and involves a series of questions to identify risk factors; so we can try to prevent possible complications. This does not need to be done by a doctor.  We have a nurse Investment banker, corporate) here that is highly qualified to do it, it can be arrange same date you have a follow up appointment with me or labs scheduled, and it 100% covered by Medicare. So before you leave today I would like for you to arrange visit with Ms Wynetta Fines for Medicare wellness visit.   No changes today. May consider decreasing or changing cholesterol medication.  Please be sure medication list is accurate. If a new problem present, please set up appointment sooner than planned today.

## 2016-09-05 NOTE — Progress Notes (Signed)
Pre visit review using our clinic review tool, if applicable. No additional management support is needed unless otherwise documented below in the visit note. 

## 2016-09-11 ENCOUNTER — Other Ambulatory Visit: Payer: Self-pay

## 2016-09-11 MED ORDER — ATORVASTATIN CALCIUM 40 MG PO TABS: 40.0000 mg | ORAL_TABLET | Freq: Every day | ORAL | 1 refills | Status: DC

## 2016-11-13 ENCOUNTER — Other Ambulatory Visit: Payer: Self-pay

## 2016-11-13 MED ORDER — METFORMIN HCL 500 MG PO TABS: 500.0000 mg | ORAL_TABLET | Freq: Two times a day (BID) | ORAL | 1 refills | Status: DC

## 2016-11-13 MED ORDER — VALSARTAN-HYDROCHLOROTHIAZIDE 160-25 MG PO TABS: 1.0000 | ORAL_TABLET | Freq: Every day | ORAL | 1 refills | Status: DC

## 2016-12-04 NOTE — Progress Notes (Addendum)
Subjective:   Tina Villanueva is a 65 y.o. female who presents for an Initial Medicare Annual Wellness Visit.  The Patient was informed that the wellness visit is to identify future health risk and educate and initiate measures that can reduce risk for increased disease through the lifespan.    Lives alone But dtr owns [employer] in [place]  One grand child- He will be 18   BP up some today States states she didn't take her procardia at hs  Coached on helpful techniques to remember to take her medicine as keeping some by her beside or taking it w her so she can consistently remember to take it after she eats.   NO ROS; Medicare Wellness Visit OV 08/2016  Describes health as good, fair or great?  Good most of the time Knees  One surgery 2005;  Left knee needs to be replaced but has postponed this  Preventive Screening -Counseling & Management   Smoking history Limited smoking hx;  Second Hand Smoke status; No Smokers in the home no ETOH socially in the past, not now  Medication adherence or issues?  Does not happen frequently Forgot bp med x 2 nights  Never forgets in the am  Takes procardia at hs  Also takes Lipitor at hs  Discussed issues of elevated BP and having to re-titrate which is hard for her body. Agreed to try and take meds with her so she can consistently take them after dinner  RISK FACTORS Diet Eats in and out  Breakfast; anything from pancakes and sausage Egg omelet ; boiled egg Lunch may skip  Supper; tries to eat vegetables and fruit Likes seafood; Trig WNL   Regular exercise  More active in the summer Water aerobics; have stopped x 2 months ago  Normally x 2 to 3 times a week  Doing some yard work now but the house is about done Belongs to the [employer] center and she is looking forward to going back there when her yard is completed; Mulching yard and planting at this time  Cardiac Risk Factors:  Advanced aged > 55 in men; >65 in  women Hyperlipidemia - chol 218; HDL 64; Trig 136; LDL 126 Diabetes 5.6 Family History  Obesity BMI 50   Fall risk due to weight and balance [place] going in home with 5"step but now put in a ramp [place] going to the bathroom Does carry her cell phone if she needs to get help  Given education on "Fall Prevention in the Home" for more safety tips the patient can apply as appropriate.  Long term goal is to "age in place" or undecided   Mobility of Functional changes this year? no One level home that is totally handicapped accessible Walk in tub; Grab bars Home is handicapped accessible  Safety in home reviewed   Mental Health: spouse has early onset Alz currently. She will assist as her dtr needs her-Issues about one year   Any emotional problems? Anxious, depressed, irritable, sad or blue? no Denies feeling depressed or hopeless; voices pleasure in daily life How many social activities have you been engaged in within the last 2 weeks? No Very extraverted; Is excited about getting in the community     Hearing Screening   125Hz  250Hz  500Hz  1000Hz  2000Hz  3000Hz  4000Hz  6000Hz  8000Hz   Right ear:       100    Left ear:       100    Vision Screening Comments: Goes every year  Dtr has MD picked out but will make apt Had last apt in march with new glasses No eye issues  could read in the office today   Activities of Daily Living - See functional screen   Cognitive testing; Ad8 score; 0 or less than 2  MMSE deferred or completed if AD8 + 2 issues  Advanced Directives no but states her dtr knows what she wants  Patient Care Team: Betty G Martinique, MD as PCP - General (Family Medicine)   Immunization History  Administered Date(s) Administered  . Pneumococcal Conjugate-13 05/02/2016   Required Immunizations needed today  Screening test up to date or reviewed for plan of completion Health Maintenance Due  Topic Date Due  . Hepatitis C Screening  Jan 09, 1951  . TETANUS/TDAP   11/14/1969  . MAMMOGRAM  11/14/2000  . COLONOSCOPY  11/14/2000  . DEXA SCAN  11/15/2015   Hepatitis C: screen at next blood draw   Tetanus: last cut her hand; over 16 yo? Not sure  Recommended completion at Pharmacy and agreed to [place]; [facility];  Thinks she had this in march of 2017;  Hx of 2 lumpectomy; both fibrocystic   Colonoscopy: had one back in 2009;  Will let us know as she has not heard from anyone regarding repeat; requested she try and confirm the fup date Looking at 2019.   Bone density completed  Primary doctor sent records to Dr. Martinique but they are not on file  Cardiac Risk Factors include: advanced age (>37men, >3 women)  Patient Care Team: Betty G Martinique, MD as PCP - General (Family Medicine) Dictation #1 [id number]  [id number]      Objective:    Today's Vitals   12/05/16 1111  BP: (!) 160/84  Pulse: (!) 51  SpO2: 98%  Weight: 293 lb 8 oz (133.1 kg)  Height: 5\' 2"  (1.575 m)   Body mass index is 53.68 kg/m.  To note, has not taken procardia in 2 nights;  Educated provided   Current Medications (verified) Outpatient Encounter Prescriptions as of 12/05/2016  Medication Sig  . atorvastatin (LIPITOR) 40 MG tablet Take 1 tablet (40 mg total) by mouth daily.  . meloxicam (MOBIC) 15 MG tablet Take 1 tablet (15 mg total) by mouth as needed for pain.  . metFORMIN (GLUCOPHAGE) 500 MG tablet Take 1 tablet (500 mg total) by mouth 2 (two) times daily with a meal.  . metoprolol succinate (TOPROL-XL) 25 MG 24 hr tablet Take 1 tablet (25 mg total) by mouth daily.  Marland Kitchen NIFEdipine (PROCARDIA XL/ADALAT-CC) 90 MG 24 hr tablet Take 1 tablet (90 mg total) by mouth daily.  . valsartan-hydrochlorothiazide (DIOVAN-HCT) 160-25 MG tablet Take 1 tablet by mouth daily.   No facility-administered encounter medications on file as of 12/05/2016.     Allergies (verified) Patient has no allergy information on record.   History: Past Medical History:    Diagnosis Date  . Arthritis   . Chicken pox   . Hyperlipidemia   . Hypertension    Past Surgical History:  Procedure Laterality Date  . BREAST SURGERY    . TONSILLECTOMY AND ADENOIDECTOMY     Family History  Problem Relation Age of Onset  . Arthritis Mother   . Hyperlipidemia Mother   . Hypertension Mother   . Stroke Mother   . Arthritis Father   . Hyperlipidemia Father   . Hypertension Father   . Stroke Father    Social History   Occupational History  . Not  on file.   Social History Main Topics  . Smoking status: Former Smoker    Packs/day: 0.30    Years: 30.00  . Smokeless tobacco: Not on file  . Alcohol use Not on file     Comment: social drinker; now not at all   . Drug use: Unknown  . Sexual activity: Not on file    Tobacco Counseling Counseling given: Yes   Activities of Daily Living In your present state of health, do you have any difficulty performing the following activities: 12/05/2016  Hearing? N  Vision? N  Difficulty concentrating or making decisions? N  Walking or climbing stairs? Y  Dressing or bathing? N  Doing errands, shopping? N  Preparing Food and eating ? N  Using the Toilet? N  In the past six months, have you accidently leaked urine? N  Do you have problems with loss of bowel control? N  Managing your Medications? N  Managing your Finances? N  Housekeeping or managing your Housekeeping? N  Some recent data might be hidden    Immunizations and Health Maintenance Immunization History  Administered Date(s) Administered  . Pneumococcal Conjugate-13 05/02/2016   Health Maintenance Due  Topic Date Due  . Hepatitis C Screening  August 29, 1950  . TETANUS/TDAP  11/14/1969  . MAMMOGRAM  11/14/2000  . COLONOSCOPY  11/14/2000  . DEXA SCAN  11/15/2015    Patient Care Team: Betty G Martinique, MD as PCP - General (Family Medicine)  Indicate any recent Medical Services you may have received from other than [facility] providers in the past year  (date may be approximate).     Assessment:   This is a routine wellness examination for Tina Villanueva.   Hearing/Vision screen  Hearing Screening   125Hz  250Hz  500Hz  1000Hz  2000Hz  3000Hz  4000Hz  6000Hz  8000Hz   Right ear:       100    Left ear:       100    Vision Screening Comments: Goes every year Dtr has MD picked out but will make apt Had last apt in march with new glasses No eye issues   Will schedule eye exam   Dietary issues and exercise activities discussed: Current Exercise Habits: Structured exercise class, Type of exercise: walking;strength training/weights;yoga, Time (Minutes): 60, Frequency (Times/Week): 4, Weekly Exercise (Minutes/Week): 240, Intensity: Moderate  Goals    . patient          Get back [place] and start back in [place] chair yoga       Depression Screen PHQ 2/9 Scores 12/05/2016  PHQ - 2 Score 0    Fall Risk Fall Risk  12/05/2016  Falls in the past year? Yes  Number falls in past yr: 2 or more  Follow up Education provided    Cognitive Function: no issues Spouse has dementia but she is doing well         Screening Tests Health Maintenance  Topic Date Due  . Hepatitis C Screening  06/11/1951  . TETANUS/TDAP  11/14/1969  . MAMMOGRAM  11/14/2000  . COLONOSCOPY  11/14/2000  . DEXA SCAN  11/15/2015  . INFLUENZA VACCINE  03/28/2017  . PNA vac Low Risk Adult (2 of 2 - PPSV23) 05/02/2017      Plan:     PCP Notes  Health Maintenance Hep c ordered;  Recommended tdap at pharmacy; Declined today  States she will schedule her mammogram at the breast center  Colonoscopy; Did not receive fup call prior to leaving [facility]  but feels it is due in 10years or in 2019. Will try to confirm date prior to changing in the epic  Deferred AD to dtr; a nurse. Given [facility] resources for questions.    Abnormal Screens   Referrals none today  Patient concerns; none today  Nurse Concerns; BP elevated but did not take medication  at hs (procardia x 2 nights)  Educated to start taking it with her when out or leaving on her nightstand at hs.   Next PCP apt in July    During the course of the visit, Sahej was educated and counseled about the following appropriate screening and preventive services:   Vaccines to include Pneumoccal, Influenza, Hepatitis B, Td, Zostavax, HCV  Electrocardiogram  Cardiovascular disease screening  Colorectal cancer screening- 2019 to confirm due date  Bone density screening- normal; had prior to leaving; awaiting medical record   Diabetes screening taking Metformin prophalactically  Glaucoma screening states neg; dtr has already recommended doctor here  Mammography/ to schedule  Nutrition counseling BMI 50; Hx of lap band; is not motivated to address currently but does stay active   Smoking cessation counseling/ n/a  Patient Instructions (the written plan) were given to the patient.    Wynetta Fines, RN   12/05/2016   04/12 call to Ms. Mcgranahan and asked her to check her BP every day and her heart rate. If BP continues to run over 140/80 and HR is less than 60, please call and come in for apt. Verbalized instructions and agreed with the plan. Wynetta Fines RN CCM

## 2016-12-05 ENCOUNTER — Ambulatory Visit (INDEPENDENT_AMBULATORY_CARE_PROVIDER_SITE_OTHER): Payer: Medicare Other

## 2016-12-05 VITALS — BP 160/84 | HR 51 | Ht 62.0 in | Wt 293.5 lb

## 2016-12-05 DIAGNOSIS — Z1159 Encounter for screening for other viral diseases: Secondary | ICD-10-CM

## 2016-12-05 DIAGNOSIS — Z0000 Encounter for general adult medical examination without abnormal findings: Secondary | ICD-10-CM | POA: Diagnosis not present

## 2016-12-05 NOTE — Patient Instructions (Addendum)
Tina Villanueva , Thank you for taking time to come for your Medicare Wellness Visit. I appreciate your ongoing commitment to your health goals. Please review the following plan we discussed and let me know if I can assist you in the future.   Medicare now request all "baby boomers" test for possible exposure to Hepatitis C. Many may have been exposed due to dental work, tatoo's, vaccinations when young. The Hepatitis C virus is dormant for many years and then sometimes will cause liver cancer. If you gave blood in the past 15 years, you were most likely checked for Hep C. If you rec'd blood; you may want to consider testing or if you are high risk for any other reason.    A Tetanus is recommended every 10 years. Medicare covers a tetanus if you have a cut or wound; otherwise, there may be a charge. If you had not had a tetanus with pertusses, known as the Tdap, you can take this anytime.   Can schedule mamogram at the breast center or [facility]; have by the time you see Dr. [place] again  Colonoscopy Did remove 2 polyps with last colonoscopy, so would like for you to request a copy or call the doctor who did the test discern the fup date.   Will try to complete AD; Given copy  Referred to [facility] for questions [place] offers free advance directive forms, as well as assistance in completing the forms themselves. For assistance, contact the Spiritual Care Department at [phone], or the Clinical Social Work Department at [phone].    These are the goals we discussed:   Goals    . patient          Get back [employer] and start back in [employer] chair yoga        This is a list of the screening recommended for you and due dates:  Health Maintenance  Topic Date Due  .  Hepatitis C: One time screening is recommended by Center for Disease Control  (CDC) for  adults born from [birth year] through 1965.   03-08-1951  . Tetanus Vaccine  11/14/1969  . Mammogram  11/14/2000  .  Colon Cancer Screening  11/14/2000  . DEXA scan (bone density measurement)  11/15/2015  . Flu Shot  03/28/2017  . Pneumonia vaccines (2 of 2 - PPSV23) 05/02/2017       Bone Densitometry Bone densitometry is an imaging test that uses a special X-ray to measure the amount of calcium and other minerals in your bones (bone density). This test is also known as a bone mineral density test or dual-energy X-ray absorptiometry (DXA). The test can measure bone density at your hip and your spine. It is similar to having a regular X-ray. You may have this test to:  Diagnose a condition that causes weak or thin bones (osteoporosis).  Predict your risk of a broken bone (fracture).  Determine how well osteoporosis treatment is working. Tell a health care provider about:  Any allergies you have.  All medicines you are taking, including vitamins, herbs, eye drops, creams, and over-the-counter medicines.  Any problems you or family members have had with anesthetic medicines.  Any blood disorders you have.  Any surgeries you have had.  Any medical conditions you have.  Possibility of pregnancy.  Any other medical test you had within the previous 14 days that used contrast material. What are the risks? Generally, this is a safe procedure. However, problems can occur and  may include the following:  This test exposes you to a very small amount of radiation.  The risks of radiation exposure may be greater to unborn children. What happens before the procedure?  Do not take any calcium supplements for 24 hours before having the test. You can otherwise eat and drink what you usually do.  Take off all metal jewelry, eyeglasses, dental appliances, and any other metal objects. What happens during the procedure?  You may lie on an exam table. There will be an X-ray generator below you and an imaging device above you.  Other devices, such as boxes or braces, may be used to position your body  properly for the scan.  You will need to lie still while the machine slowly scans your body.  The images will show up on a computer monitor. What happens after the procedure? You may need more testing at a later time. This information is not intended to replace advice given to you by your health care provider. Make sure you discuss any questions you have with your health care provider. Document Released: 09/05/2004 Document Revised: 01/20/2016 Document Reviewed: 01/22/2014 Elsevier Interactive Patient Education  2017 [place].   Colonoscopy, Adult A colonoscopy is an exam to look at the entire large intestine. During the exam, a lubricated, bendable tube is inserted into the anus and then passed into the rectum, colon, and other parts of the large intestine. A colonoscopy is often done as a part of normal colorectal screening or in response to certain symptoms, such as anemia, persistent diarrhea, abdominal pain, and blood in the stool. The exam can help screen for and diagnose medical problems, including:  Tumors.  Polyps.  Inflammation.  Areas of bleeding. Tell a health care provider about:  Any allergies you have.  All medicines you are taking, including vitamins, herbs, eye drops, creams, and over-the-counter medicines.  Any problems you or family members have had with anesthetic medicines.  Any blood disorders you have.  Any surgeries you have had.  Any medical conditions you have.  Any problems you have had passing stool. What are the risks? Generally, this is a safe procedure. However, problems may occur, including:  Bleeding.  A tear in the intestine.  A reaction to medicines given during the exam.  Infection (rare). What happens before the procedure? Eating and drinking restrictions  Follow instructions from your health care provider about eating and drinking, which may include:  A few days before the procedure - follow a low-fiber diet. Avoid nuts,  seeds, dried fruit, raw fruits, and vegetables.  1-3 days before the procedure - follow a clear liquid diet. Drink only clear liquids, such as clear broth or bouillon, black coffee or tea, clear juice, clear soft drinks or sports drinks, gelatin dessert, and popsicles. Avoid any liquids that contain red or purple dye.  On the day of the procedure - do not eat or drink anything during the 2 hours before the procedure, or within the time period that your health care provider recommends. Bowel prep  If you were prescribed an oral bowel prep to clean out your colon:  Take it as told by your health care provider. Starting the day before your procedure, you will need to drink a large amount of medicated liquid. The liquid will cause you to have multiple loose stools until your stool is almost clear or light green.  If your skin or anus gets irritated from diarrhea, you may use these to relieve the irritation:  Medicated wipes, such as adult wet wipes with aloe and vitamin E.  A skin soothing-product like petroleum jelly.  If you vomit while drinking the bowel prep, take a break for up to 60 minutes and then begin the bowel prep again. If vomiting continues and you cannot take the bowel prep without vomiting, call your health care provider. General instructions   Ask your health care provider about changing or stopping your regular medicines. This is especially important if you are taking diabetes medicines or blood thinners.  Plan to have someone take you home from the hospital or clinic. What happens during the procedure?  An IV tube may be inserted into one of your veins.  You will be given medicine to help you relax (sedative).  To reduce your risk of infection:  Your health care team will wash or sanitize their hands.  Your anal area will be washed with soap.  You will be asked to lie on your side with your knees bent.  Your health care provider will lubricate a long, thin, flexible  tube. The tube will have a camera and a light on the end.  The tube will be inserted into your anus.  The tube will be gently eased through your rectum and colon.  Air will be delivered into your colon to keep it open. You may feel some pressure or cramping.  The camera will be used to take images during the procedure.  A small tissue sample may be removed from your body to be examined under a microscope (biopsy). If any potential problems are found, the tissue will be sent to a lab for testing.  If small polyps are found, your health care provider may remove them and have them checked for cancer cells.  The tube that was inserted into your anus will be slowly removed. The procedure may vary among health care providers and hospitals. What happens after the procedure?  Your blood pressure, heart rate, breathing rate, and blood oxygen level will be monitored until the medicines you were given have worn off.  Do not drive for 24 hours after the exam.  You may have a small amount of blood in your stool.  You may pass gas and have mild abdominal cramping or bloating due to the air that was used to inflate your colon during the exam.  It is up to you to get the results of your procedure. Ask your health care provider, or the department performing the procedure, when your results will be ready. This information is not intended to replace advice given to you by your health care provider. Make sure you discuss any questions you have with your health care provider. Document Released: 08/11/2000 Document Revised: 06/14/2016 Document Reviewed: 10/26/2015 Elsevier Interactive Patient Education  2017 [place] Prevention in the Home Falls can cause injuries. They can happen to people of all ages. There are many things you can do to make your home safe and to help prevent falls. What can I do on the outside of my home?  Regularly fix the edges of walkways and driveways and fix any  cracks.  Remove anything that might make you trip as you walk through a door, such as a raised step or threshold.  Trim any bushes or trees on the path to your home.  Use bright outdoor lighting.  Clear any walking paths of anything that might make someone trip, such as rocks or tools.  Regularly check to see if handrails  are loose or broken. Make sure that both sides of any steps have handrails.  Any raised decks and porches should have guardrails on the edges.  Have any leaves, snow, or ice cleared regularly.  Use sand or salt on walking paths during winter.  Clean up any spills in your garage right away. This includes oil or grease spills. What can I do in the bathroom?  Use night lights.  Install grab bars by the toilet and in the tub and shower. Do not use towel bars as grab bars.  Use non-skid mats or decals in the tub or shower.  If you need to sit down in the shower, use a plastic, non-slip stool.  Keep the floor dry. Clean up any water that spills on the floor as soon as it happens.  Remove soap buildup in the tub or shower regularly.  Attach bath mats securely with double-sided non-slip rug tape.  Do not have throw rugs and other things on the floor that can make you trip. What can I do in the bedroom?  Use night lights.  Make sure that you have a light by your bed that is easy to reach.  Do not use any sheets or blankets that are too big for your bed. They should not hang down onto the floor.  Have a firm chair that has side arms. You can use this for support while you get dressed.  Do not have throw rugs and other things on the floor that can make you trip. What can I do in the kitchen?  Clean up any spills right away.  Avoid walking on wet floors.  Keep items that you use a lot in easy-to-reach places.  If you need to reach something above you, use a strong step stool that has a grab bar.  Keep electrical cords out of the way.  Do not use floor  polish or wax that makes floors slippery. If you must use wax, use non-skid floor wax.  Do not have throw rugs and other things on the floor that can make you trip. What can I do with my stairs?  Do not leave any items on the stairs.  Make sure that there are handrails on both sides of the stairs and use them. Fix handrails that are broken or loose. Make sure that handrails are as long as the stairways.  Check any carpeting to make sure that it is firmly attached to the stairs. Fix any carpet that is loose or worn.  Avoid having throw rugs at the top or bottom of the stairs. If you do have throw rugs, attach them to the floor with carpet tape.  Make sure that you have a light switch at the top of the stairs and the bottom of the stairs. If you do not have them, ask someone to add them for you. What else can I do to help prevent falls?  Wear shoes that:  Do not have high heels.  Have rubber bottoms.  Are comfortable and fit you well.  Are closed at the toe. Do not wear sandals.  If you use a stepladder:  Make sure that it is fully opened. Do not climb a closed stepladder.  Make sure that both sides of the stepladder are locked into place.  Ask someone to hold it for you, if possible.  Clearly mark and make sure that you can see:  Any grab bars or handrails.  First and last steps.  Where the edge of  each step is.  Use tools that help you move around (mobility aids) if they are needed. These include:  Canes.  Walkers.  Scooters.  Crutches.  Turn on the lights when you go into a dark area. Replace any light bulbs as soon as they burn out.  Set up your furniture so you have a clear path. Avoid moving your furniture around.  If any of your floors are uneven, fix them.  If there are any pets around you, be aware of where they are.  Review your medicines with your doctor. Some medicines can make you feel dizzy. This can increase your chance of falling. Ask your  doctor what other things that you can do to help prevent falls. This information is not intended to replace advice given to you by your health care provider. Make sure you discuss any questions you have with your health care provider. Document Released: 06/10/2009 Document Revised: 01/20/2016 Document Reviewed: 09/18/2014 Elsevier Interactive Patient Education  2017 [employer] Inc[employer]  Health Maintenance, Female Adopting a healthy lifestyle and getting preventive care can go a long way to promote health and wellness. Talk with your health care provider about what schedule of regular examinations is right for you. This is a good chance for you to check in with your provider about disease prevention and staying healthy. In between checkups, there are plenty of things you can do on your own. Experts have done a lot of research about which lifestyle changes and preventive measures are most likely to keep you healthy. Ask your health care provider for more information. Weight and diet Eat a healthy diet  Be sure to include plenty of vegetables, fruits, low-fat dairy products, and lean protein.  Do not eat a lot of foods high in solid fats, added sugars, or salt.  Get regular exercise. This is one of the most important things you can do for your health.  Most adults should exercise for at least 150 minutes each week. The exercise should increase your heart rate and make you sweat (moderate-intensity exercise).  Most adults should also do strengthening exercises at least twice a week. This is in addition to the moderate-intensity exercise. Maintain a healthy weight  Body mass index (BMI) is a measurement that can be used to identify possible weight problems. It estimates body fat based on height and weight. Your health care provider can help determine your BMI and help you achieve or maintain a healthy weight.  For females 1 years of age and older:  A BMI below 18.5 is considered underweight.  A  BMI of 18.5 to 24.9 is normal.  A BMI of 25 to 29.9 is considered overweight.  A BMI of 30 and above is considered obese. Watch levels of cholesterol and blood lipids  You should start having your blood tested for lipids and cholesterol at 66 years of age, then have this test every 5 years.  You may need to have your cholesterol levels checked more often if:  Your lipid or cholesterol levels are high.  You are older than 66 years of age.  You are at high risk for heart disease. Cancer screening Lung Cancer  Lung cancer screening is recommended for adults 36-27 years old who are at high risk for lung cancer because of a history of smoking.  A yearly low-dose CT scan of the lungs is recommended for people who:  Currently smoke.  Have quit within the past 15 years.  Have at least a 30-pack-year history of  smoking. A pack year is smoking an average of one pack of cigarettes a day for 1 year.  Yearly screening should continue until it has been 15 years since you quit.  Yearly screening should stop if you develop a health problem that would prevent you from having lung cancer treatment. Breast Cancer  Practice breast self-awareness. This means understanding how your breasts normally appear and feel.  It also means doing regular breast self-exams. Let your health care provider know about any changes, no matter how small.  If you are in your 20s or 30s, you should have a clinical breast exam (CBE) by a health care provider every 1-3 years as part of a regular health exam.  If you are 58 or older, have a CBE every year. Also consider having a breast X-ray (mammogram) every year.  If you have a family history of breast cancer, talk to your health care provider about genetic screening.  If you are at high risk for breast cancer, talk to your health care provider about having an MRI and a mammogram every year.  Breast cancer gene (BRCA) assessment is recommended for women who have  family members with BRCA-related cancers. BRCA-related cancers include:  Breast.  Ovarian.  Tubal.  Peritoneal cancers.  Results of the assessment will determine the need for genetic counseling and BRCA1 and BRCA2 testing. Cervical Cancer  Your health care provider may recommend that you be screened regularly for cancer of the pelvic organs (ovaries, uterus, and vagina). This screening involves a pelvic examination, including checking for microscopic changes to the surface of your cervix (Pap test). You may be encouraged to have this screening done every 3 years, beginning at age 66.  For women ages 36-65, health care providers may recommend pelvic exams and Pap testing every 3 years, or they may recommend the Pap and pelvic exam, combined with testing for human papilloma virus (HPV), every 5 years. Some types of HPV increase your risk of cervical cancer. Testing for HPV may also be done on women of any age with unclear Pap test results.  Other health care providers may not recommend any screening for nonpregnant women who are considered low risk for pelvic cancer and who do not have symptoms. Ask your health care provider if a screening pelvic exam is right for you.  If you have had past treatment for cervical cancer or a condition that could lead to cancer, you need Pap tests and screening for cancer for at least 20 years after your treatment. If Pap tests have been discontinued, your risk factors (such as having a new sexual partner) need to be reassessed to determine if screening should resume. Some women have medical problems that increase the chance of getting cervical cancer. In these cases, your health care provider may recommend more frequent screening and Pap tests. Colorectal Cancer  This type of cancer can be detected and often prevented.  Routine colorectal cancer screening usually begins at 66 years of age and continues through 66 years of age.  Your health care provider may  recommend screening at an earlier age if you have risk factors for colon cancer.  Your health care provider may also recommend using home test kits to check for hidden blood in the stool.  A small camera at the end of a tube can be used to examine your colon directly (sigmoidoscopy or colonoscopy). This is done to check for the earliest forms of colorectal cancer.  Routine screening usually begins at age 64.  Direct examination of the colon should be repeated every 5-10 years through 66 years of age. However, you may need to be screened more often if early forms of precancerous polyps or small growths are found. Skin Cancer  Check your skin from head to toe regularly.  Tell your health care provider about any new moles or changes in moles, especially if there is a change in a mole's shape or color.  Also tell your health care provider if you have a mole that is larger than the size of a pencil eraser.  Always use sunscreen. Apply sunscreen liberally and repeatedly throughout the day.  Protect yourself by wearing long sleeves, pants, a wide-brimmed hat, and sunglasses whenever you are outside. Heart disease, diabetes, and high blood pressure  High blood pressure causes heart disease and increases the risk of stroke. High blood pressure is more likely to develop in:  People who have blood pressure in the high end of the normal range (130-139/85-89 mm Hg).  People who are overweight or obese.  People who are African American.  If you are 51-46 years of age, have your blood pressure checked every 3-5 years. If you are 61 years of age or older, have your blood pressure checked every year. You should have your blood pressure measured twice-once when you are at a hospital or clinic, and once when you are not at a hospital or clinic. Record the average of the two measurements. To check your blood pressure when you are not at a hospital or clinic, you can use:  An automated blood pressure  machine at a pharmacy.  A home blood pressure monitor.  If you are between 50 years and 37 years old, ask your health care provider if you should take aspirin to prevent strokes.  Have regular diabetes screenings. This involves taking a blood sample to check your fasting blood sugar level.  If you are at a normal weight and have a low risk for diabetes, have this test once every three years after 66 years of age.  If you are overweight and have a high risk for diabetes, consider being tested at a younger age or more often. Preventing infection Hepatitis B  If you have a higher risk for hepatitis B, you should be screened for this virus. You are considered at high risk for hepatitis B if:  You were born in a country where hepatitis B is common. Ask your health care provider which countries are considered high risk.  Your parents were born in a high-risk country, and you have not been immunized against hepatitis B (hepatitis B vaccine).  You have HIV or AIDS.  You use needles to inject street drugs.  You live with someone who has hepatitis B.  You have had sex with someone who has hepatitis B.  You get hemodialysis treatment.  You take certain medicines for conditions, including cancer, organ transplantation, and autoimmune conditions. Hepatitis C  Blood testing is recommended for:  Everyone born from [birth year] through 1965.  Anyone with known risk factors for hepatitis C. Sexually transmitted infections (STIs)  You should be screened for sexually transmitted infections (STIs) including gonorrhea and chlamydia if:  You are sexually active and are younger than 66 years of age.  You are older than 66 years of age and your health care provider tells you that you are at risk for this type of infection.  Your sexual activity has changed since you were last screened and you are at an increased  risk for chlamydia or gonorrhea. Ask your health care provider if you are at risk.  If  you do not have HIV, but are at risk, it may be recommended that you take a prescription medicine daily to prevent HIV infection. This is called pre-exposure prophylaxis (PrEP). You are considered at risk if:  You are sexually active and do not regularly use condoms or know the HIV status of your partner(s).  You take drugs by injection.  You are sexually active with a partner who has HIV. Talk with your health care provider about whether you are at high risk of being infected with HIV. If you choose to begin PrEP, you should first be tested for HIV. You should then be tested every 3 months for as long as you are taking PrEP. Pregnancy  If you are premenopausal and you may become pregnant, ask your health care provider about preconception counseling.  If you may become pregnant, take 400 to 800 micrograms (mcg) of folic acid every day.  If you want to prevent pregnancy, talk to your health care provider about birth control (contraception). Osteoporosis and menopause  Osteoporosis is a disease in which the bones lose minerals and strength with aging. This can result in serious bone fractures. Your risk for osteoporosis can be identified using a bone density scan.  If you are 75 years of age or older, or if you are at risk for osteoporosis and fractures, ask your health care provider if you should be screened.  Ask your health care provider whether you should take a calcium or vitamin D supplement to lower your risk for osteoporosis.  Menopause may have certain physical symptoms and risks.  Hormone replacement therapy may reduce some of these symptoms and risks. Talk to your health care provider about whether hormone replacement therapy is right for you. Follow these instructions at home:  Schedule regular health, dental, and eye exams.  Stay current with your immunizations.  Do not use any tobacco products including cigarettes, chewing tobacco, or electronic cigarettes.  If you are  pregnant, do not drink alcohol.  If you are breastfeeding, limit how much and how often you drink alcohol.  Limit alcohol intake to no more than 1 drink per day for nonpregnant women. One drink equals 12 ounces of beer, 5 ounces of wine, or 1 ounces of hard liquor.  Do not use street drugs.  Do not share needles.  Ask your health care provider for help if you need support or information about quitting drugs.  Tell your health care provider if you often feel depressed.  Tell your health care provider if you have ever been abused or do not feel safe at home. This information is not intended to replace advice given to you by your health care provider. Make sure you discuss any questions you have with your health care provider. Document Released: 02/27/2011 Document Revised: 01/20/2016 Document Reviewed: 05/18/2015 Elsevier Interactive Patient Education  2017 [place] A mammogram is an X-ray of the breasts that is done to check for abnormal changes. This procedure can screen for and detect any changes that may suggest breast cancer. A mammogram can also identify other changes and variations in the breast, such as:  Inflammation of the breast tissue (mastitis).  An infected area that contains a collection of pus (abscess).  A fluid-filled sac (cyst).  Fibrocystic changes. This is when breast tissue becomes denser, which can make the tissue feel rope-like or uneven under the skin.  Tumors that are not cancerous (benign). Tell a health care provider about:  Any allergies you have.  If you have breast implants.  If you have had previous breast disease, biopsy, or surgery.  If you are breastfeeding.  Any possibility that you could be pregnant, if this applies.  If you are younger than age 6.  If you have a family history of breast cancer. What are the risks? Generally, this is a safe procedure. However, problems may occur, including:  Exposure to  radiation. Radiation levels are very low with this test.  The results being misinterpreted.  The need for further tests.  The inability of the mammogram to detect certain cancers. What happens before the procedure?  Schedule your test about 1-2 weeks after your menstrual period. This is usually when your breasts are the least tender.  If you have had a mammogram done at a different facility in the past, get the mammogram X-rays or have them sent to your current exam facility in order to compare them.  Wash your breasts and under your arms the day of the test.  Do not wear deodorants, perfumes, lotions, or powders anywhere on your body on the day of the test.  Remove any jewelry from your neck.  Wear clothes that you can change into and out of easily. What happens during the procedure?  You will undress from the waist up and put on a gown.  You will stand in front of the X-ray machine.  Each breast will be placed between two plastic or glass plates. The plates will compress your breast for a few seconds. Try to stay as relaxed as possible during the procedure. This does not cause any harm to your breasts and any discomfort you feel will be very brief.  X-rays will be taken from different angles of each breast. The procedure may vary among health care providers and hospitals. What happens after the procedure?  The mammogram will be examined by a specialist (radiologist).  You may need to repeat certain parts of the test, depending on the quality of the images. This is commonly done if the radiologist needs a better view of the breast tissue.  Ask when your test results will be ready. Make sure you get your test results.  You may resume your normal activities. This information is not intended to replace advice given to you by your health care provider. Make sure you discuss any questions you have with your health care provider. Document Released: 08/11/2000 Document Revised:  01/17/2016 Document Reviewed: 10/23/2014 Elsevier Interactive Patient Education  2017 [employer].

## 2016-12-05 NOTE — Progress Notes (Signed)
I have reviewed documentation from this visit and I agree with recommendations given. BP elevated and mild bradycardia,she needs to monitor BP and HR at home,may need to follow sooner.  Betty G. Martinique, MD  [facility]. [place] office.

## 2017-01-28 DIAGNOSIS — R197 Diarrhea, unspecified: Secondary | ICD-10-CM | POA: Diagnosis not present

## 2017-01-28 DIAGNOSIS — R1084 Generalized abdominal pain: Secondary | ICD-10-CM | POA: Diagnosis not present

## 2017-03-05 NOTE — Progress Notes (Signed)
HPI:   Tina Villanueva is a 66 y.o. female, who is here today to follow on some chronic medical problems.  Since her last OV, 09/05/16, she had her Medicare preventive visit on 12/05/16.  Hypertension:   Dx a few years ago.  Currently on Valsartan-HCT 160-25 mg, Metoprolol Succinate 25 mg daily,and Procardia XL 90 mg daily.   BP's 120-130's/70-80's. She is following low salt diet. Eye exam last year.  No side effects reported.  Denies headache, visual changes, exertional chest pain, dyspnea, or focal weakness.  + LE edema, worse for the past 2 weeks, L>R, exacerbated by prolonged standing and better in the morning when she first gets up. She denies leg pain or erythema.   Lab Results  Component Value Date   CREATININE 0.69 09/05/2016   BUN 16 09/05/2016   NA 140 09/05/2016   K 4.0 09/05/2016   CL 101 09/05/2016   CO2 30 09/05/2016    Knee OA:  Currently she is on Mobic 15 mg daily as needed. Pain 5/10, achy. Exacerbated by walking and prolonged standing. Alleviated by rest.  IFG: Currently she is on Metformin ,which was decreased from 1000 mg bid to 500 mg bid.  Lab Results  Component Value Date   HGBA1C 5.6 09/05/2016   Requesting something for "nerve pain". She mentions Hx of left foot burning and tingling sensation, worse for the past couple weeks, attributes it to increased edema and hot weather. States that in the past her former PCP prescribed topical med that did not help.She could not afford oral medication prescribed a few years ago. Denies lower back pain with radiation.  She is not exercising but planning on starting aquatic exercises. She is trying to follow a healthy diet.   Hx of OSA, she is not wearing CPAP, could not tolerate mask.  Hyperlipidemia:  Currently on Lipitor 40 mg daily, changed from Zocor last OV. Following a low fat diet: yes.  She has not noted side effects with medication.  Lab Results  Component Value Date   CHOL 218 (H) 09/05/2016   HDL 64.10 09/05/2016   LDLCALC 126 (H) 09/05/2016   TRIG 136.0 09/05/2016   CHOLHDL 3 09/05/2016    Review of Systems  Constitutional: Positive for fatigue (no more than usual). Negative for activity change, appetite change, fever and unexpected weight change.  HENT: Negative for mouth sores, nosebleeds and trouble swallowing.   Eyes: Negative for redness and visual disturbance.  Respiratory: Negative for cough, shortness of breath and wheezing.   Cardiovascular: Positive for leg swelling. Negative for chest pain and palpitations.  Gastrointestinal: Negative for abdominal pain, nausea and vomiting.       Negative for changes in bowel habits.  Endocrine: Negative for cold intolerance, heat intolerance, polydipsia, polyphagia and polyuria.  Genitourinary: Negative for decreased urine volume and hematuria.  Musculoskeletal: Positive for arthralgias. Negative for myalgias.  Skin: Negative for rash.  Neurological: Negative for syncope, weakness, light-headedness and headaches.  Psychiatric/Behavioral: Negative for confusion. The patient is not nervous/anxious.       Current Outpatient Prescriptions on File Prior to Visit  Medication Sig Dispense Refill  . atorvastatin (LIPITOR) 40 MG tablet Take 1 tablet (40 mg total) by mouth daily. 90 tablet 1  . meloxicam (MOBIC) 15 MG tablet Take 1 tablet (15 mg total) by mouth as needed for pain. 90 tablet 1  . metFORMIN (GLUCOPHAGE) 500 MG tablet Take 1 tablet (500 mg total) by mouth 2 (two) times  daily with a meal. 180 tablet 1  . NIFEdipine (PROCARDIA XL/ADALAT-CC) 90 MG 24 hr tablet Take 1 tablet (90 mg total) by mouth daily. 90 tablet 2   No current facility-administered medications on file prior to visit.      Past Medical History:  Diagnosis Date  . Arthritis   . Chicken pox   . Hyperlipidemia   . Hypertension    Not on File  Social History   Social History  . Marital status: Unknown    Spouse name:  N/A  . Number of children: N/A  . Years of education: N/A   Social History Main Topics  . Smoking status: Former Smoker    Packs/day: 0.30    Years: 30.00  . Smokeless tobacco: Never Used  . Alcohol use None     Comment: social drinker; now not at all   . Drug use: Unknown  . Sexual activity: Not Asked   Other Topics Concern  . None   Social History Narrative   Live alone;    Lives in single level home; one level    Moved here due to retirement x 1 year ago   Brand new house   Walk in tub and oversized shower        Vitals:   03/06/17 1121 03/06/17 1215  BP: 140/80   Pulse: 64 (!) 56  Resp: 12    Body mass index is 53.77 kg/m.  Wt Readings from Last 3 Encounters:  03/06/17 294 lb (133.4 kg)  12/05/16 293 lb 8 oz (133.1 kg)  09/05/16 283 lb 2 oz (128.4 kg)     Physical Exam  Nursing note and vitals reviewed. Constitutional: She is oriented to person, place, and time. She appears well-developed. No distress.  HENT:  Head: Atraumatic.  Mouth/Throat: Oropharynx is clear and moist and mucous membranes are normal.  Eyes: Conjunctivae and EOM are normal. Pupils are equal, round, and reactive to light.  Cardiovascular: Regular rhythm.  Bradycardia present.   Murmur (SEM I/VI RUSB) heard. Pulses:      Dorsalis pedis pulses are 2+ on the right side, and 2+ on the left side.  Respiratory: Effort normal and breath sounds normal. No respiratory distress.  GI: Soft. She exhibits no mass. There is no tenderness.  Musculoskeletal: She exhibits edema (2+ pitting LE edema, bilateral.Pedal pitting edema 2+ L>R). She exhibits no tenderness.  Lymphadenopathy:    She has no cervical adenopathy.  Neurological: She is alert and oriented to person, place, and time. She has normal strength.  Antalgic gait, not assisted.  Skin: Skin is warm. No erythema.  Psychiatric: She has a normal mood and affect.  Well groomed, good eye contact.    ASSESSMENT AND PLAN:   [name] was  seen today for follow-up.  Diagnoses and all orders for this visit:  Lab Results  Component Value Date   CREATININE 0.68 03/06/2017   BUN 14 03/06/2017   NA 142 03/06/2017   K 4.2 03/06/2017   CL 101 03/06/2017   CO2 31 03/06/2017   Lab Results  Component Value Date   VITAMINB12 283 03/06/2017   Lab Results  Component Value Date   HGBA1C 5.6 03/06/2017   Lab Results  Component Value Date   CHOL 215 (H) 03/06/2017   HDL 68.60 03/06/2017   LDLCALC 122 (H) 03/06/2017   TRIG 120.0 03/06/2017   CHOLHDL 3 03/06/2017    Essential hypertension, benign   Based on reported home BP readings problem seems  adequately controlled. Because bradycardia Metoprolol dose decreased. No changes in rest of her meds, continue monitoring BP.  DASH-low salt diet recommended. Eye exam recommended annually. F/U in 3 months, before if needed.  -     Basic metabolic panel -     EKG 67-HALP -     metoprolol succinate (TOPROL-XL) 25 MG 24 hr tablet; Take 0.5 tablets (12.5 mg total) by mouth daily. -     valsartan-hydrochlorothiazide (DIOVAN-HCT) 320-25 MG tablet; Take 1 tablet by mouth daily.  BMI 50.0-59.9, adult ([place])  Since 08/2016 she has gained about 10-11 Lb. We discussed benefits of wt loss as well as adverse effects of obesity. Consistency with healthy diet and physical activity recommended.   IFG (impaired fasting glucose)  No changes in current management, will follow labs done today and will give further recommendations accordingly.  -     Hemoglobin A1c  Bilateral primary osteoarthritis of knee  Stable.  No changes in current management, side effects of NSAID's discussed. Wt loss may help. F/U in 6-12 months.  Hyperlipidemia, unspecified hyperlipidemia type  No changes in current management, will follow labs done today and will give further recommendations accordingly. F/U in 6-12 months.  -     Lipid panel  Burning sensation of feet  ? Peripheral  neuropathy. After discussion of treatment options and side effects she agrees with trying Gabapentin, will start with 100 mg at bedtime and will titrate up to 300 mg as tolerated, F/U in 2-3 months. Fall prevention discussed.  -     Vitamin B12 -     gabapentin (NEURONTIN) 100 MG capsule; Take 3 capsules (300 mg total) by mouth at bedtime.  Bilateral lower extremity edema  Possible causes dicussed: obesity,vein disease,meds, and chronic diseases among some. LE elevation and compression stocking may help. Wt loss recommended. Skin care and injury prevention also recommended.  Sinus bradycardia  Mild and asymptomatic. EKG today: Sinus bradycardia, LAD, unsp T wave abnormalities, no signs of acute ischemia. No other EKG available for comparison. Metoprolol dose decreased from 25 mg to 12.5 mg. Instructed about warning signs. F/U in 3 months.  -     EKG 12-Lead    -Ms. Porter Nakama was advised to return sooner than planned today if new concerns arise.       Betty G. Martinique, MD  [facility]. [place] office.

## 2017-03-06 ENCOUNTER — Encounter: Payer: Self-pay | Admitting: Family Medicine

## 2017-03-06 ENCOUNTER — Ambulatory Visit (INDEPENDENT_AMBULATORY_CARE_PROVIDER_SITE_OTHER): Payer: Medicare Other | Admitting: Family Medicine

## 2017-03-06 VITALS — BP 140/80 | HR 56 | Resp 12 | Ht 62.0 in | Wt 294.0 lb

## 2017-03-06 DIAGNOSIS — R001 Bradycardia, unspecified: Secondary | ICD-10-CM

## 2017-03-06 DIAGNOSIS — R208 Other disturbances of skin sensation: Secondary | ICD-10-CM | POA: Diagnosis not present

## 2017-03-06 DIAGNOSIS — I10 Essential (primary) hypertension: Secondary | ICD-10-CM | POA: Diagnosis not present

## 2017-03-06 DIAGNOSIS — Z1159 Encounter for screening for other viral diseases: Secondary | ICD-10-CM

## 2017-03-06 DIAGNOSIS — R600 Localized edema: Secondary | ICD-10-CM | POA: Diagnosis not present

## 2017-03-06 DIAGNOSIS — Z6841 Body Mass Index (BMI) 40.0 and over, adult: Secondary | ICD-10-CM

## 2017-03-06 DIAGNOSIS — R7301 Impaired fasting glucose: Secondary | ICD-10-CM

## 2017-03-06 DIAGNOSIS — M170 Bilateral primary osteoarthritis of knee: Secondary | ICD-10-CM | POA: Diagnosis not present

## 2017-03-06 DIAGNOSIS — E785 Hyperlipidemia, unspecified: Secondary | ICD-10-CM

## 2017-03-06 HISTORY — DX: Localized edema: R60.0

## 2017-03-06 LAB — VITAMIN B12: Vitamin B-12: 283 pg/mL (ref 211–911)

## 2017-03-06 LAB — LIPID PANEL
CHOL/HDL RATIO: 3
CHOLESTEROL: 215 mg/dL — AB (ref 0–200)
HDL: 68.6 mg/dL (ref >39.00)
LDL Cholesterol: 122 mg/dL — ABNORMAL HIGH (ref 0–99)
NonHDL: 146.09
TRIGLYCERIDES: 120 mg/dL (ref 0.0–149.0)
VLDL: 24 mg/dL (ref 0.0–40.0)

## 2017-03-06 LAB — BASIC METABOLIC PANEL
BUN: 14 mg/dL (ref 6–23)
CALCIUM: 9.6 mg/dL (ref 8.4–10.5)
CO2: 31 meq/L (ref 19–32)
Chloride: 101 mEq/L (ref 96–112)
Creatinine, Ser: 0.68 mg/dL (ref 0.40–1.20)
GFR: 111.23 mL/min (ref >60.00)
GLUCOSE: 107 mg/dL — AB (ref 70–99)
POTASSIUM: 4.2 meq/L (ref 3.5–5.1)
SODIUM: 142 meq/L (ref 135–145)

## 2017-03-06 LAB — HEMOGLOBIN A1C: Hgb A1c MFr Bld: 5.6 % (ref 4.6–6.5)

## 2017-03-06 MED ORDER — METOPROLOL SUCCINATE ER 25 MG PO TB24: 12.5000 mg | ORAL_TABLET | Freq: Every day | ORAL | 1 refills | Status: DC

## 2017-03-06 MED ORDER — GABAPENTIN 100 MG PO CAPS: 300.0000 mg | ORAL_CAPSULE | Freq: Every day | ORAL | 1 refills | Status: DC

## 2017-03-06 MED ORDER — VALSARTAN-HYDROCHLOROTHIAZIDE 320-25 MG PO TABS: 1.0000 | ORAL_TABLET | Freq: Every day | ORAL | 2 refills | Status: DC

## 2017-03-06 NOTE — Patient Instructions (Addendum)
A few things to remember from today's visit:   Essential hypertension, benign - Plan: Basic metabolic panel, EKG 29-VBTY  BMI 50.0-59.9, adult ([facility])  IFG (impaired fasting glucose) - Plan: Hemoglobin A1c  Bilateral primary osteoarthritis of knee  Hyperlipidemia, unspecified hyperlipidemia type - Plan: Lipid panel  Burning sensation of feet - Plan: Vitamin B12, gabapentin (NEURONTIN) 100 MG capsule  Bilateral lower extremity edema  Encounter for screening for other viral diseases (CODE) - Plan: Hepatitis C Antibody  Gabapentin 100 mg at bedtime, increase by one cap weekly until 300 mg at bedtime as tolerated.   Vein disease is a condition that can affect the veins in the legs. It can cause leg pain, varicose veins, swollen legs, or open sores. Varicose veins are swollen and twisted veins. Things that may help: leg exercises (ankle flexion, walking),compression stocking, OTC horse chestnut seed extract 300 mg twice daily, if itchy skin cortisone and moisturizers.  Compression stockings- [facility] in [place]  Please be sure medication list is accurate. If a new problem present, please set up appointment sooner than planned today.

## 2017-03-07 LAB — HEPATITIS C ANTIBODY: HCV Ab: NEGATIVE

## 2017-03-21 ENCOUNTER — Other Ambulatory Visit: Payer: Self-pay | Admitting: Family Medicine

## 2017-04-03 ENCOUNTER — Telehealth: Payer: Self-pay | Admitting: Family Medicine

## 2017-04-03 NOTE — Telephone Encounter (Signed)
Pt needs a new Rx replacement for recalled valsartan-hydrochlorothiazide (DIOVAN-HCT) 320-25 MG tablet     [place] ([facility]), [place] [facility] [place]

## 2017-04-03 NOTE — Telephone Encounter (Signed)
Pharmacies are having issues trying to get the manufacturers that are not on the recall.   Is there another medication that can be sent in?

## 2017-04-04 MED ORDER — OLMESARTAN MEDOXOMIL 40 MG PO TABS: 40.0000 mg | ORAL_TABLET | Freq: Every day | ORAL | 3 refills | Status: DC

## 2017-04-04 NOTE — Telephone Encounter (Signed)
I really prefer for pharmacy to replace medication for one that was not compromised, even if she has to go to a different pharmacy.  [She is already on a diuretic (HCTZ), CCB (Procardia), and BB (Metoprolol)]. We do not have many options for a new agent. A possibility is to replace Valsartan for another in the same group, Olmesartan 40 mg, not sure about insurance coverage.  Thanks, BJ

## 2017-04-04 NOTE — Telephone Encounter (Signed)
Called and spoke with patient. New Rx for Olmesartan sent into pharmacy. Patient aware & nothing further needed.

## 2017-04-25 ENCOUNTER — Encounter: Payer: Self-pay | Admitting: Family Medicine

## 2017-04-25 ENCOUNTER — Ambulatory Visit (INDEPENDENT_AMBULATORY_CARE_PROVIDER_SITE_OTHER): Payer: Medicare Other | Admitting: Family Medicine

## 2017-04-25 ENCOUNTER — Encounter (HOSPITAL_COMMUNITY): Payer: Self-pay | Admitting: Emergency Medicine

## 2017-04-25 ENCOUNTER — Emergency Department (HOSPITAL_COMMUNITY)
Admission: EM | Admit: 2017-04-25 | Discharge: 2017-04-25 | Disposition: A | Discharge status: Home / Self Care | Payer: Medicare Other | Attending: Emergency Medicine | Admitting: Emergency Medicine

## 2017-04-25 ENCOUNTER — Emergency Department (HOSPITAL_BASED_OUTPATIENT_CLINIC_OR_DEPARTMENT_OTHER)
Admit: 2017-04-25 | Discharge: 2017-04-25 | Disposition: A | Discharge status: Home / Self Care | Payer: Medicare Other | Attending: Emergency Medicine | Admitting: Emergency Medicine

## 2017-04-25 VITALS — BP 132/80 | HR 69 | Resp 12 | Ht 62.0 in | Wt 299.1 lb

## 2017-04-25 DIAGNOSIS — R600 Localized edema: Secondary | ICD-10-CM | POA: Diagnosis not present

## 2017-04-25 DIAGNOSIS — Z7984 Long term (current) use of oral hypoglycemic drugs: Secondary | ICD-10-CM | POA: Diagnosis not present

## 2017-04-25 DIAGNOSIS — R011 Cardiac murmur, unspecified: Secondary | ICD-10-CM | POA: Diagnosis not present

## 2017-04-25 DIAGNOSIS — M7989 Other specified soft tissue disorders: Secondary | ICD-10-CM | POA: Diagnosis not present

## 2017-04-25 DIAGNOSIS — R2242 Localized swelling, mass and lump, left lower limb: Secondary | ICD-10-CM | POA: Diagnosis present

## 2017-04-25 DIAGNOSIS — I82442 Acute embolism and thrombosis of left tibial vein: Secondary | ICD-10-CM | POA: Diagnosis not present

## 2017-04-25 DIAGNOSIS — Z87891 Personal history of nicotine dependence: Secondary | ICD-10-CM | POA: Insufficient documentation

## 2017-04-25 DIAGNOSIS — I10 Essential (primary) hypertension: Secondary | ICD-10-CM | POA: Diagnosis not present

## 2017-04-25 DIAGNOSIS — R609 Edema, unspecified: Secondary | ICD-10-CM | POA: Diagnosis not present

## 2017-04-25 DIAGNOSIS — Z79899 Other long term (current) drug therapy: Secondary | ICD-10-CM | POA: Insufficient documentation

## 2017-04-25 LAB — I-STAT CHEM 8, ED
BUN: 12 mg/dL (ref 6–20)
CALCIUM ION: 1.07 mmol/L — AB (ref 1.15–1.40)
Chloride: 101 mmol/L (ref 101–111)
Creatinine, Ser: 0.7 mg/dL (ref 0.44–1.00)
Glucose, Bld: 97 mg/dL (ref 65–99)
HEMATOCRIT: 40 % (ref 36.0–46.0)
HEMOGLOBIN: 13.6 g/dL (ref 12.0–15.0)
Potassium: 4 mmol/L (ref 3.5–5.1)
SODIUM: 140 mmol/L (ref 135–145)
TCO2: 27 mmol/L (ref 22–32)

## 2017-04-25 LAB — CBC
HCT: 39.1 % (ref 36.0–46.0)
HEMOGLOBIN: 12.6 g/dL (ref 12.0–15.0)
MCH: 29.9 pg (ref 26.0–34.0)
MCHC: 32.2 g/dL (ref 30.0–36.0)
MCV: 92.9 fL (ref 78.0–100.0)
PLATELETS: 276 10*3/uL (ref 150–400)
RBC: 4.21 MIL/uL (ref 3.87–5.11)
RDW: 13.7 % (ref 11.5–15.5)
WBC: 7.4 10*3/uL (ref 4.0–10.5)

## 2017-04-25 MED ORDER — RIVAROXABAN 15 MG PO TABS
15.0000 mg | ORAL_TABLET | Freq: Once | ORAL | Status: AC
  Administered 2017-04-25: 15 mg via ORAL
  Filled 2017-04-25: qty 1

## 2017-04-25 MED ORDER — RIVAROXABAN (XARELTO) EDUCATION KIT FOR DVT/PE PATIENTS
PACK | Freq: Once | Status: AC
  Administered 2017-04-25: 18:00:00
  Filled 2017-04-25: qty 1

## 2017-04-25 MED ORDER — RIVAROXABAN (XARELTO) VTE STARTER PACK (15 & 20 MG): ORAL_TABLET | ORAL | 0 refills | Status: DC

## 2017-04-25 MED ORDER — OLMESARTAN MEDOXOMIL-HCTZ 40-25 MG PO TABS: 1.0000 | ORAL_TABLET | Freq: Every day | ORAL | 1 refills | Status: DC

## 2017-04-25 NOTE — ED Notes (Signed)
Pt status and complaint reviewed with Ralene Bathe, MD; verbal orders placed.

## 2017-04-25 NOTE — Patient Instructions (Addendum)
A few things to remember from today's visit:   Leg edema, left - Plan: VAS [place] LOWER EXTREMITY VENOUS (DVT)  Heart murmur  Essential hypertension, benign  Benicar HCT sent.  We need to evaluate for DVT, so please go to the ER.  Vein disease is a condition that can affect the veins in the legs. It can cause leg pain, varicose veins, swollen legs, or open sores. Varicose veins are swollen and twisted veins. Things that may help: leg exercises (ankle flexion, walking),compression stocking, OTC horse chestnut seed extract 300 mg twice daily, for itchy skin cortisone and moisturizers.  Compression stockings- [facility] in [place]  Please be sure medication list is accurate. If a new problem present, please set up appointment sooner than planned today.

## 2017-04-25 NOTE — Progress Notes (Addendum)
**  Preliminary report by tech**  Left lower extremity venous duplex complete. There is evidence of acute deep vein thrombosis involving the posterior tibial veins of the left lower extremity. There is no evidence of superficial vein thrombosis involving the left lower extremity. There is no evidence of a Baker's cyst on the left. Results were given to Dr. Ralene Bathe.   04/25/17 4:42 PM Tina Villanueva RVT

## 2017-04-25 NOTE — ED Triage Notes (Addendum)
Pt sent by PCP to confirm pt does not have a "clot to the bend of my left ankle." Subjective pain and swelling for a week. Denies hx of same.

## 2017-04-25 NOTE — ED Provider Notes (Signed)
[facility] DEPT Provider Note   CSN: [id number] Arrival date & time: 04/25/17  1538     History   Chief Complaint Chief Complaint  Patient presents with  . Ankle Pain    HPI Tina Villanueva is a 66 y.o. female.  HPI   Tina Villanueva is a 66 year old female with a history of hypertension, hyperlipidemia, NIDDM, OSA, morbid obesity who was sent to the emergency department by her primary care physician for evaluation of left leg swelling today. She states that she noticed that her left ankle and lower calf was swollen about a week ago. She says that she noticed it was red in color and that her ankle is tight and painful. She states that she typically has bilateral lower leg swelling throughout the day, but it usually improves by the morning. At this time she describes the pain as "tight" and that it is a 4/10 in severity. She states that walking and moving the ankle worsens the pain. She denies any injury to the left leg. She states that she has never had a clot in her leg before. She denies shortness of breath, chest pain hemoptysis, fever, cancer history, recent surgery, OCP use. She did go on a long car ride about a week ago where she sat in the car for about 3 hours.  Past Medical History:  Diagnosis Date  . Arthritis   . Chicken pox   . Hyperlipidemia   . Hypertension     Patient Active Problem List   Diagnosis Date Noted  . Bilateral lower extremity edema 03/06/2017  . Hyperlipidemia 09/05/2016  . IFG (impaired fasting glucose) 09/05/2016  . BMI 50.0-59.9, adult ([place]) 05/02/2016  . OSA (obstructive sleep apnea) 05/02/2016  . Essential hypertension, benign 05/02/2016  . Bilateral primary osteoarthritis of knee 05/02/2016    Past Surgical History:  Procedure Laterality Date  . BREAST SURGERY    . TONSILLECTOMY AND ADENOIDECTOMY      OB History    No data available       Home Medications    Prior to Admission medications   Medication Sig Start Date End Date  Taking? Authorizing Provider  atorvastatin (LIPITOR) 40 MG tablet TAKE ONE TABLET BY MOUTH ONCE DAILY 03/22/17   Martinique, Betty G, MD  gabapentin (NEURONTIN) 100 MG capsule Take 3 capsules (300 mg total) by mouth at bedtime. 03/06/17   Martinique, Betty G, MD  meloxicam (MOBIC) 15 MG tablet Take 1 tablet (15 mg total) by mouth as needed for pain. 09/05/16   Martinique, Betty G, MD  metFORMIN (GLUCOPHAGE) 500 MG tablet Take 1 tablet (500 mg total) by mouth 2 (two) times daily with a meal. 11/13/16   Martinique, Betty G, MD  metoprolol succinate (TOPROL-XL) 25 MG 24 hr tablet Take 0.5 tablets (12.5 mg total) by mouth daily. 03/06/17   Martinique, Betty G, MD  NIFEdipine (PROCARDIA XL/ADALAT-CC) 90 MG 24 hr tablet Take 1 tablet (90 mg total) by mouth daily. 09/05/16   Martinique, Betty G, MD  olmesartan-hydrochlorothiazide (BENICAR HCT) 40-25 MG tablet Take 1 tablet by mouth daily. 04/25/17   Martinique, Betty G, MD  Rivaroxaban 15 & 20 MG TBPK Take as directed on package: Start with one '15mg'$  tablet by mouth twice a day with food. On Day 22, switch to one '20mg'$  tablet once a day with food. 04/25/17   Glyn Ade, PA-C    Family History Family History  Problem Relation Age of Onset  . Arthritis Mother   . Hyperlipidemia  Mother   . Hypertension Mother   . Stroke Mother   . Arthritis Father   . Hyperlipidemia Father   . Hypertension Father   . Stroke Father     Social History Social History  Substance Use Topics  . Smoking status: Former Smoker    Packs/day: 0.30    Years: 30.00  . Smokeless tobacco: Never Used  . Alcohol use Not on file     Comment: social drinker; now not at all      Allergies   Patient has no known allergies.   Review of Systems Review of Systems  Constitutional: Negative for chills, fatigue and fever.  Respiratory: Negative for cough, shortness of breath and wheezing.   Cardiovascular: Positive for leg swelling (left lower calf and ankle). Negative for chest pain and palpitations.    Gastrointestinal: Negative for abdominal pain.  Endocrine: Negative for polyuria.  Genitourinary: Negative for dysuria.  Musculoskeletal: Positive for joint swelling (left ankle).  Skin: Positive for color change (red, left lower leg). Negative for rash and wound.  Neurological: Negative for weakness.  Psychiatric/Behavioral: Negative for agitation.     Physical Exam Updated Vital Signs BP (!) 143/71 (BP Location: Left Arm)   Pulse 60   Temp 97.8 F (36.6 C) (Oral)   Resp 18   Ht '5\' 2"'$  (1.575 m)   Wt 135.6 kg (299 lb)   SpO2 98%   BMI 54.69 kg/m   Physical Exam  Constitutional: She is oriented to person, place, and time. She appears well-developed and well-nourished.  HENT:  Head: Normocephalic and atraumatic.  Mouth/Throat: Oropharynx is clear and moist.  Eyes: Right eye exhibits no discharge. Left eye exhibits no discharge.  Neck: Normal range of motion. Neck supple.  Cardiovascular: Normal rate and regular rhythm.  Exam reveals no gallop and no friction rub.   Pulmonary/Chest: Effort normal and breath sounds normal. No respiratory distress. She has no wheezes. She has no rales.  Abdominal: Soft. Bowel sounds are normal. There is no tenderness.  Musculoskeletal: Normal range of motion.  Left foot, ankle and leg (up to about the midcalf) edematous, erythematous. 1+ pitting edema in bilateral lower extremities below the calf. Full range of motion in bilateral ankle, knee joints. Pulses 1+ in bilateral PT and DP.   Neurological: She is alert and oriented to person, place, and time. Coordination normal.  Skin: Skin is warm and dry. Capillary refill takes less than 2 seconds.  Psychiatric: She has a normal mood and affect.  Nursing note and vitals reviewed.    ED Treatments / Results  Labs (all labs ordered are listed, but only abnormal results are displayed) Labs Reviewed  CBC  I-STAT CHEM 8, ED    EKG  EKG Interpretation None       Radiology No results  found.  Procedures Procedures (including critical care time)  Medications Ordered in ED Medications  rivaroxaban (XARELTO) Education Kit for DVT/PE patients (not administered)  Rivaroxaban (XARELTO) tablet 15 mg (not administered)     Initial Impression / Assessment and Plan / ED Course  I have reviewed the triage vital signs and the nursing notes.  Pertinent labs & imaging results that were available during my care of the patient were reviewed by me and considered in my medical decision making (see chart for details).   Doppler reveals DVT in the left lower extremity (left posterior tibial vein.) Do not suspect PE at this time as patient is not tachycardic, tachypnic, SpO2 at 98% and  she denies SOB, hemoptysis, chest pain.  Patient's kidney function good Cr 0.7, and platelets normal. Will start her on Xarelto and have her follow up with her primary care physician. Discussed strict return precautions and patient agrees and voices understanding.    At this time there does not appear to be any evidence of an acute emergency medical condition and the patient appears stable for discharge with appropriate outpatient follow up. Pt case discussed with Dr. Lita Mains who agrees with this plan.    Final Clinical Impressions(s) / ED Diagnoses   Final diagnoses:  Acute deep vein thrombosis (DVT) of tibial vein of left lower extremity (HCC)    New Prescriptions New Prescriptions   RIVAROXABAN 15 & 20 MG TBPK    Take as directed on package: Start with one '15mg'$  tablet by mouth twice a day with food. On Day 22, switch to one '20mg'$  tablet once a day with food.     Glyn Ade, PA-C 04/25/17 1756    Julianne Rice, MD 04/30/17 [phone]

## 2017-04-25 NOTE — Progress Notes (Signed)
ACUTE VISIT   HPI:  Chief Complaint  Patient presents with  . LLE edema    Tina Villanueva is a 66 y.o. female, who is here today with her daughter complaining of worsening LLE edema.  She has history of lower extremity edema, usually aggravated by travel, but this time it seems to be worse. She recently drove with her daughter for over 2 hours, 04/04/17. She has also noted pigmentation changes on left lower extremity, pruritus,and achy/tingling sensation on pretibial area.  She denies associated chills,decreased urine output,gross hematuria,or foam in urine, back pain, or cold extremity.   Other  This is a recurrent problem. The current episode started in the past 7 days. The problem has been unchanged. Associated symptoms include myalgias. Pertinent negatives include no abdominal pain, change in bowel habit, chest pain, congestion, coughing, diaphoresis, fatigue, fever, headaches, nausea, rash, swollen glands, vomiting or weakness. Nothing aggravates the symptoms. She has tried nothing for the symptoms.    Her daughter is concerned about the possibility of CHF and concerned about heart murmur heard during her last OV.  Daughter has noted some SOB/tireness for the past 3 years,gradual onset. Tina Villanueva denies exertional dyspnea or chest pain.  She denies orthopnea or PND, she sleeps in a pillow.  Hx of OSA, she does not wear her CPAP.   Review of Systems  Constitutional: Negative for activity change, appetite change, diaphoresis, fatigue, fever and unexpected weight change.  HENT: Negative for congestion, mouth sores, nosebleeds and trouble swallowing.   Eyes: Negative for redness and visual disturbance.  Respiratory: Negative for cough, shortness of breath and wheezing.   Cardiovascular: Positive for leg swelling. Negative for chest pain and palpitations.  Gastrointestinal: Negative for abdominal pain, change in bowel habit, nausea and vomiting.       Negative for  changes in bowel habits.  Genitourinary: Negative for decreased urine volume, dysuria and hematuria.  Musculoskeletal: Positive for myalgias. Negative for back pain and gait problem.  Skin: Negative for rash and wound.  Neurological: Negative for syncope, weakness and headaches.  Psychiatric/Behavioral: Negative for confusion. The patient is nervous/anxious.       Current Outpatient Prescriptions on File Prior to Visit  Medication Sig Dispense Refill  . atorvastatin (LIPITOR) 40 MG tablet TAKE ONE TABLET BY MOUTH ONCE DAILY 90 tablet 1  . gabapentin (NEURONTIN) 100 MG capsule Take 3 capsules (300 mg total) by mouth at bedtime. 90 capsule 1  . meloxicam (MOBIC) 15 MG tablet Take 1 tablet (15 mg total) by mouth as needed for pain. 90 tablet 1  . metFORMIN (GLUCOPHAGE) 500 MG tablet Take 1 tablet (500 mg total) by mouth 2 (two) times daily with a meal. 180 tablet 1  . metoprolol succinate (TOPROL-XL) 25 MG 24 hr tablet Take 0.5 tablets (12.5 mg total) by mouth daily. 90 tablet 1  . NIFEdipine (PROCARDIA XL/ADALAT-CC) 90 MG 24 hr tablet Take 1 tablet (90 mg total) by mouth daily. 90 tablet 2   No current facility-administered medications on file prior to visit.      Past Medical History:  Diagnosis Date  . Arthritis   . Chicken pox   . Heart murmur   . Hyperlipidemia   . Hypertension    No Known Allergies  Social History   Social History  . Marital status: Unknown    Spouse name: N/A  . Number of children: N/A  . Years of education: N/A   Social History Main Topics  .  Smoking status: Former Smoker    Packs/day: 0.30    Years: 30.00  . Smokeless tobacco: Never Used  . Alcohol use None     Comment: social drinker; now not at all   . Drug use: Unknown  . Sexual activity: Not Asked   Other Topics Concern  . None   Social History Narrative   Live alone;    Lives in single level home; one level    Moved here due to retirement x 1 year ago   Brand new house   Walk in  tub and oversized shower        Vitals:   04/25/17 1419  BP: 132/80  Pulse: 69  Resp: 12  SpO2: 94%   Body mass index is 54.71 kg/m.  Physical Exam  Nursing note and vitals reviewed. Constitutional: She is oriented to person, place, and time. She appears well-developed. No distress.  HENT:  Head: Normocephalic and atraumatic.  Mouth/Throat: Oropharynx is clear and moist and mucous membranes are normal.  Eyes: Pupils are equal, round, and reactive to light. Conjunctivae are normal.  Neck: No JVD present.  Cardiovascular: Normal rate and regular rhythm.   Murmur (SEM I/VI LUSB>RUSB) heard. Pulses:      Dorsalis pedis pulses are 2+ on the right side, and 2+ on the left side.  Respiratory: Effort normal and breath sounds normal. No respiratory distress.  GI: Soft. She exhibits no mass. There is no hepatomegaly. There is no tenderness.  Musculoskeletal: She exhibits edema (LLE  And pedal 2+ pitting edema,RLE trace pitting LE edema. Right pedal edema 1+). She exhibits no tenderness.  Lymphadenopathy:    She has no cervical adenopathy.  Neurological: She is alert and oriented to person, place, and time. She has normal strength. Coordination normal.  Skin: Skin is warm. No rash noted. No erythema.  Hyperpigmentation changes on left lower extremity, dorsum of foot and anterior aspect of the left ankle.  Psychiatric: She has a normal mood and affect.  Well groomed, good eye contact.    ASSESSMENT AND PLAN:   Tina Villanueva was seen today for lle edema.  Diagnoses and all orders for this visit:  Leg edema, left  We discussed possible causes of lower extremity edema, including DVT as well as venous disease. Base on examination and Hx the probability of this being a DVT seems low but because reporting worsening I still think venous US is warrant. We tried to schedule as outpt but no availability until next Friday pm. So I recommend going to the ER. She has not been taking her HCTZ, so  new prescription for Benicar HCT was sent to the pharmacy. Education about vein disease was provided.Wt can aggravate problem. Encouraged to wear compression stockings. She will keep Appointment.  -     VAS [place] LOWER EXTREMITY VENOUS (DVT); Future  Essential hypertension, benign  BP today is adequately controlled. Because she is resuming HCTZ recommend monitoring BP at home. Low salt diet. Keep F/U appt.  -     olmesartan-hydrochlorothiazide (BENICAR HCT) 40-25 MG tablet; Take 1 tablet by mouth daily.  Heart murmur  Murmur is mild. I have not received records from former PCP. Long discussion with her daughter about auscultation findings during prior office visits and possible causes. We planned for an echo but Tina Villanueva prefers to hold on further work-up. She reports having an echocardiogram about 3 years ago because a heart murmur, according to patient, negative. Apparently her daughter was not aware.  Return if symptoms worsen or fail to improve, for Keep appt..     Betty G. Martinique, MD  [facility]. [place] office.

## 2017-04-25 NOTE — ED Provider Notes (Signed)
Pt seen and examined by me. Pt with chronic peripheral edema, noticed 2 weeks ago that left leg is more swollen than usual. Was seen by PCP today, sent here to ro DVT. No known risk factors. No hx of the same. Pt denies any CP, SOB. No pain in her leg.   Exam: Pt in NAD. Lungs clear bilaterally. Regurlar HR and rhythm. Legs edematous bilaterally, 2+ edema. Left leg slightly more swollen than right. No calf tenderness. Normal knee exam. Foot pink, cap refill <2 sec.   Plan to dc home with xarelto. Dicussed with pt about side effects of xarelto. Will check CBC and chem 8. Follow up with pcp.   Vitals:   04/25/17 1559 04/25/17 1600  BP:  (!) 143/71  Pulse:  60  Resp:  18  Temp:  97.8 F (36.6 C)  TempSrc:  Oral  SpO2:  98%  Weight: 135.6 kg (299 lb)   Height: 5\' 2"  (1.575 m)       Jeannett Senior, PA-C 04/25/17 1704    Julianne Rice, MD 04/30/17 [phone]

## 2017-04-25 NOTE — Discharge Instructions (Signed)
Start taking Xarelto 15mg  twice a day for 21 days. On day 22 start, taking 20mg  once a day.  Do not take your meloxicam while taking Xarelto  Please make an appointment with your primary doctor for recheck in a week.  Please return to the ER if you have shortness of breath, chest pain, blood in your sputum, or new or worsening symptoms.   Information on my medicine - XARELTO (rivaroxaban)  WHY WAS XARELTO PRESCRIBED FOR YOU? Xarelto was prescribed to treat blood clots that may have been found in the veins of your legs (deep vein thrombosis) or in your lungs (pulmonary embolism) and to reduce the risk of them occurring again.  WHAT DO YOU NEED TO KNOW ABOUT XARELTO? The starting dose is one 15 mg tablet taken TWICE daily with food for the FIRST 21 DAYS then on May 17, 2017  the dose is changed to one 20 mg tablet taken ONCE A DAY with your evening meal.  DO NOT stop taking Xarelto without talking to the health care provider who prescribed the medication.  Refill your prescription for 20 mg tablets before you run out.  After discharge, you should have regular check-up appointments with your healthcare provider that is prescribing your Xarelto.  In the future your dose may need to be changed if your kidney function changes by a significant amount.  WHAT DO YOU DO IF YOU MISS A DOSE? If you are taking Xarelto TWICE DAILY and you miss a dose, take it as soon as you remember. You may take two 15 mg tablets (total 30 mg) at the same time then resume your regularly scheduled 15 mg twice daily the next day.  If you are taking Xarelto ONCE DAILY and you miss a dose, take it as soon as you remember on the same day then continue your regularly scheduled once daily regimen the next day. Do not take two doses of Xarelto at the same time.   IMPORTANT SAFETY INFORMATION Xarelto is a blood thinner medicine that can cause bleeding. You should call your healthcare provider right away if you  experience any of the following: Bleeding from an injury or your nose that does not stop. Unusual colored urine (red or dark brown) or unusual colored stools (red or black). Unusual bruising for unknown reasons. A serious fall or if you hit your head (even if there is no bleeding).  Some medicines may interact with Xarelto and might increase your risk of bleeding while on Xarelto. To help avoid this, consult your healthcare provider or pharmacist prior to using any new prescription or non-prescription medications, including herbals, vitamins, non-steroidal anti-inflammatory drugs (NSAIDs) and supplements.  This website has more information on Xarelto: [email or web address]

## 2017-05-10 NOTE — Progress Notes (Signed)
HPI:   Tina Villanueva is a 66 y.o. female, who is here today to follow on recent OV and ER evaluation.   She was seen on 04/25/17 c/o left LE edema, which was mildly worse than her usual. We could not arrange leg venous US through the office,so she was sent to the ER.  Dx with acute DVT left tibial vein.  Lab Results  Component Value Date   WBC 7.4 04/25/2017   HGB 13.6 04/25/2017   HCT 40.0 04/25/2017   MCV 92.9 04/25/2017   PLT 276 04/25/2017   Lab Results  Component Value Date   CREATININE 0.70 04/25/2017   BUN 12 04/25/2017   NA 140 04/25/2017   K 4.0 04/25/2017   CL 101 04/25/2017   LLE venous US: There is evidence of acute deep vein thrombosis involving the posterior tibial veins of the left lower extremity.  There is no evidence of superficial vein thrombosis involving the left lower extremity.  She was started on Xarelto, she is on starting kit, 15 mg bid. She is tolerating medication well, denies side effects. Denies dyspnea,palpitations,diaphoresis,or dizziness.    HTN:  She is currently on Benicar HCT 40-25 mg daily which she has tolerated well. She is also taking Procardia XL 90 mg daily and Metoprolol Succinate 25 mg half tablet daily.  Metoprolol was decreased from 25 mg because mild bradycardia. She has not checked HR at home.  Denies headache, visual changes, chest pain, dyspnea, focal weakness, or worsening LE edema.  She denies orthopnea or PND.  She has not checked blood pressure at home since she was changed from Valsartan to Benicar HCT.  Hx of generalized OA, she was on Mobic but discontinue after Xarelto was started. She is now taking Acetaminophen and joint supplementation, the latter really helps but she noticed worsening joint pain after she discontinued Mobic. Mainly pain in left knee.    Review of Systems  Constitutional: Negative for activity change, appetite change, fatigue and fever.  HENT: Negative for mouth sores,  nosebleeds and trouble swallowing.   Eyes: Negative for redness and visual disturbance.  Respiratory: Negative for cough, shortness of breath and wheezing.   Cardiovascular: Positive for leg swelling. Negative for chest pain and palpitations.  Gastrointestinal: Negative for abdominal pain, blood in stool, nausea and vomiting.       Negative for changes in bowel habits.  Endocrine: Negative for cold intolerance and heat intolerance.  Genitourinary: Negative for decreased urine volume, dysuria and hematuria.  Musculoskeletal: Positive for arthralgias. Negative for gait problem and myalgias.  Skin: Negative for pallor and rash.  Neurological: Negative for syncope, weakness, light-headedness and headaches.  Hematological: Negative for adenopathy. Does not bruise/bleed easily.  Psychiatric/Behavioral: Negative for confusion. The patient is not nervous/anxious.       Current Outpatient Prescriptions on File Prior to Visit  Medication Sig Dispense Refill  . atorvastatin (LIPITOR) 40 MG tablet TAKE ONE TABLET BY MOUTH ONCE DAILY 90 tablet 1  . gabapentin (NEURONTIN) 100 MG capsule Take 3 capsules (300 mg total) by mouth at bedtime. 90 capsule 1  . metFORMIN (GLUCOPHAGE) 500 MG tablet Take 1 tablet (500 mg total) by mouth 2 (two) times daily with a meal. 180 tablet 1  . NIFEdipine (PROCARDIA XL/ADALAT-CC) 90 MG 24 hr tablet Take 1 tablet (90 mg total) by mouth daily. 90 tablet 2  . olmesartan-hydrochlorothiazide (BENICAR HCT) 40-25 MG tablet Take 1 tablet by mouth daily. 90 tablet 1  . Rivaroxaban  15 & 20 MG TBPK Take as directed on package: Start with one '15mg'$  tablet by mouth twice a day with food. On Day 22, switch to one '20mg'$  tablet once a day with food. 51 each 0   No current facility-administered medications on file prior to visit.      Past Medical History:  Diagnosis Date  . Arthritis   . Chicken pox   . Heart murmur   . Hyperlipidemia   . Hypertension    No Known  Allergies  Social History   Social History  . Marital status: Unknown    Spouse name: N/A  . Number of children: N/A  . Years of education: N/A   Social History Main Topics  . Smoking status: Former Smoker    Packs/day: 0.30    Years: 30.00  . Smokeless tobacco: Never Used  . Alcohol use None     Comment: social drinker; now not at all   . Drug use: Unknown  . Sexual activity: Not Asked   Other Topics Concern  . None   Social History Narrative   Live alone;    Lives in single level home; one level    Moved here due to retirement x 1 year ago   Brand new house   Walk in tub and oversized shower        Vitals:   05/11/17 0952 05/11/17 1044  BP: 130/80   Pulse: 77 (!) 48  Resp: 12   SpO2: 94%    Body mass index is 53.77 kg/m.   Physical Exam  Nursing note and vitals reviewed. Constitutional: She is oriented to person, place, and time. She appears well-developed. No distress.  HENT:  Head: Normocephalic and atraumatic.  Mouth/Throat: Oropharynx is clear and moist and mucous membranes are normal.  Eyes: Pupils are equal, round, and reactive to light. Conjunctivae are normal.  Cardiovascular: Regular rhythm.  Bradycardia present.   No murmur heard. DP and PT present bilateral. No heart murmur heard today.  Respiratory: Effort normal and breath sounds normal. No respiratory distress.  Musculoskeletal: She exhibits edema (2+ pitting edema LE , bilateral. Pedal pitting edema left 3+.). She exhibits no tenderness.   Left calf soft, not tender, still increased circumference. Antalgic gait with no assistance.  Neurological: She is alert and oriented to person, place, and time. She has normal strength. Coordination normal.  Skin: Skin is warm. No rash noted. No erythema.  Psychiatric: She has a normal mood and affect.  Well groomed, good eye contact.    ASSESSMENT AND PLAN:   Tina Villanueva was seen today for er follow-up.  Diagnoses and all orders for this  visit:  Acute deep vein thrombosis (DVT) of left tibial vein (HCC)  We discussed diagnosis, prognosis, risk factors, and treatment recommendations. We also discussed side effects of Xorelto. She will complete 3 months of Xorelto 20 mg daily (she will start after she completes starting kit). Instructed about warning signs. Recommended wearing compression stockings, mainly if she is considering long travel. Follow-up in 2-3 months.  -     rivaroxaban (XARELTO) 20 MG TABS tablet; Take 1 tablet (20 mg total) by mouth daily with supper.  Essential hypertension, benign  Adequately controlled. Continue Benicar HCT and Procardia XL. Because bradycardia, I recommend weaning off Metoprolol. DASH-low salt diet recommended. Instructed to monitor BP at home. F/U in 2-3 months.  -     metoprolol succinate (TOPROL-XL) 25 MG 24 hr tablet; 1/2 tab everh other day for 10  days then every 3 days for a week and stop  Bradycardia, sinus  Asymptomatic. Metoprolol Succinate to be weaned off. Instructed to monitor HR at home periodically.  Instructed about warning signs. Follow-up in 2-3 months.  Need for influenza vaccination -     Flu vaccine HIGH DOSE PF  Healthcare maintenance  She has not had Zoster vaccination before, she is interested in getting the new vaccine. Rx for Shingrix given.  -     Zoster Vac Recomb Adjuvanted [facility]) injection; Inject 0.5 mLs into the muscle every 3 (three) months. -     Flu vaccine HIGH DOSE PF    25 min face to face OV. > 50% was dedicated to discussion of Dx, prognosis,side effects of some of her medications. In regard to osteoarthritis, she can continue Tylenol 500 mg 3-4 times per day and OTC supplementation( contents glucosamine, B6, vitamin C, and Zinc).we can consider resuming More week after she completes treatment with Xorelto.  We will move appt she had in 05/2017 to 07/2017.     Allene Furuya G. Martinique, MD  [facility]. [place]  office.

## 2017-05-11 ENCOUNTER — Ambulatory Visit (INDEPENDENT_AMBULATORY_CARE_PROVIDER_SITE_OTHER): Payer: Medicare Other | Admitting: Family Medicine

## 2017-05-11 ENCOUNTER — Ambulatory Visit: Payer: Medicare Other | Admitting: Family Medicine

## 2017-05-11 ENCOUNTER — Encounter: Payer: Self-pay | Admitting: Family Medicine

## 2017-05-11 VITALS — BP 130/80 | HR 48 | Resp 12 | Ht 62.0 in | Wt 294.0 lb

## 2017-05-11 DIAGNOSIS — Z23 Encounter for immunization: Secondary | ICD-10-CM | POA: Diagnosis not present

## 2017-05-11 DIAGNOSIS — Z0000 Encounter for general adult medical examination without abnormal findings: Secondary | ICD-10-CM | POA: Diagnosis not present

## 2017-05-11 DIAGNOSIS — R001 Bradycardia, unspecified: Secondary | ICD-10-CM | POA: Diagnosis not present

## 2017-05-11 DIAGNOSIS — I82442 Acute embolism and thrombosis of left tibial vein: Secondary | ICD-10-CM

## 2017-05-11 DIAGNOSIS — I10 Essential (primary) hypertension: Secondary | ICD-10-CM

## 2017-05-11 MED ORDER — ZOSTER VAC RECOMB ADJUVANTED 50 MCG/0.5ML IM SUSR: 0.5000 mL | INTRAMUSCULAR | 1 refills | Status: DC

## 2017-05-11 MED ORDER — RIVAROXABAN 20 MG PO TABS: 20.0000 mg | ORAL_TABLET | Freq: Every day | ORAL | 0 refills | Status: DC

## 2017-05-11 MED ORDER — METOPROLOL SUCCINATE ER 25 MG PO TB24: ORAL_TABLET | ORAL | 1 refills | Status: DC

## 2017-05-11 NOTE — Patient Instructions (Addendum)
A few things to remember from today's visit:   Essential hypertension, benign - Plan: metoprolol succinate (TOPROL-XL) 25 MG 24 hr tablet  Acute deep vein thrombosis (DVT) of left tibial vein (HCC) - Plan: rivaroxaban (XARELTO) 20 MG TABS tablet  Bilateral lower extremity edema  Bradycardia, sinus  Check pulse at home. We may need to wean off Metoprolol  Metoprolol every other day for 10 days then every 3 days for a week and then stop it. Continue monitoring.  Xarelto to take for 3 months.  Compression stocking recommended.   Please be sure medication list is accurate. If a new problem present, please set up appointment sooner than planned today.

## 2017-06-06 ENCOUNTER — Ambulatory Visit: Payer: Medicare Other | Admitting: Family Medicine

## 2017-07-04 ENCOUNTER — Other Ambulatory Visit: Payer: Self-pay | Admitting: Family Medicine

## 2017-07-04 DIAGNOSIS — R208 Other disturbances of skin sensation: Secondary | ICD-10-CM

## 2017-08-09 DIAGNOSIS — G629 Polyneuropathy, unspecified: Secondary | ICD-10-CM

## 2017-08-09 HISTORY — DX: Polyneuropathy, unspecified: G62.9

## 2017-08-09 NOTE — Progress Notes (Signed)
HPI:   [name] is a 66 y.o. female, who is here today to follow on some chronic medical problems.  HTN: She is on Benicar HCT 40-25 mg daily and Procardia XL 90 mg daily. Last OV because persistent bradycardia after decreasing Metoprolol Succinate dose, recommend weaning it off. Home BP's: 120's/70's.  Denies severe/frequent headache, visual changes, chest pain, dyspnea, palpitation, claudication, focal weakness, or worsening edema. + OSA, she is not on CPAP. Last eye exam: A year ago.  Hx of LE edema, venous disease. She would like to see a vein specialist because she has had edema for years and would like to have veins evaluated. L>R. In general LE edema has improved.  DVT: She is on Xarelto 20 mg daily. Dx on 04/25/17. No prior Hx of DVT or PE.  IFG:  Lab Results  Component Value Date   HGBA1C 5.6 03/06/2017   Metformin was discontinued in 02/2017 but she is still taking 1000 mg daily.   Left foot burning and tingling sensation, chronic. Gabapentin 300 mg at bedtime. Reporting Hx of RLS, cramps and tingling feeling,intermittent for years.  Symptoms have improved with Gabapentin but not resolved.  Obesity:  Dietary changes since last OV: None Exercise: Tai Chi once per week.  In general she follows a healthy diet.  03/2017 mild hyperCa++, 1.07 mmol/L  HLD: Currently on Lipitor 40 mg daily. 02/2017 TC 218,HDL 68,TG 120,LDL 122. She is following low fat diet. Tolerating medication well.   Review of Systems  Constitutional: Negative for activity change, appetite change, fatigue and fever.  HENT: Negative for mouth sores, nosebleeds and trouble swallowing.   Eyes: Negative for redness and visual disturbance.  Respiratory: Negative for cough, shortness of breath and wheezing.   Cardiovascular: Positive for leg swelling. Negative for chest pain and palpitations.  Gastrointestinal: Negative for abdominal pain, nausea and vomiting.   Negative for changes in bowel habits.  Endocrine: Negative for cold intolerance, heat intolerance, polydipsia, polyphagia and polyuria.  Genitourinary: Negative for decreased urine volume and hematuria.  Musculoskeletal: Positive for arthralgias and myalgias. Negative for joint swelling.  Skin: Negative for rash and wound.  Neurological: Negative for syncope, weakness and headaches.  Psychiatric/Behavioral: Negative for confusion. The patient is nervous/anxious.       Current Outpatient Medications on File Prior to Visit  Medication Sig Dispense Refill  . atorvastatin (LIPITOR) 40 MG tablet TAKE ONE TABLET BY MOUTH ONCE DAILY 90 tablet 1  . NIFEdipine (PROCARDIA XL/ADALAT-CC) 90 MG 24 hr tablet Take 1 tablet (90 mg total) by mouth daily. 90 tablet 2  . olmesartan-hydrochlorothiazide (BENICAR HCT) 40-25 MG tablet Take 1 tablet by mouth daily. 90 tablet 1  . rivaroxaban (XARELTO) 20 MG TABS tablet Take 1 tablet (20 mg total) by mouth daily with supper. 90 tablet 0  . metoprolol succinate (TOPROL-XL) 25 MG 24 hr tablet 1/2 tab everh other day for 10 days then every 3 days for a week and stop 90 tablet 1   No current facility-administered medications on file prior to visit.      Past Medical History:  Diagnosis Date  . Arthritis   . Chicken pox   . Heart murmur   . Hyperlipidemia   . Hypertension    No Known Allergies  Social History   Socioeconomic History  . Marital status: Unknown    Spouse name: None  . Number of children: None  . Years of education: None  . Highest education level: None  Social Needs  . Financial resource strain: None  . Food insecurity - worry: None  . Food insecurity - inability: None  . Transportation needs - medical: None  . Transportation needs - non-medical: None  Occupational History  . None  Tobacco Use  . Smoking status: Former Smoker    Packs/day: 0.30    Years: 30.00    Pack years: 9.00  . Smokeless tobacco: Never Used  Substance and  Sexual Activity  . Alcohol use: None    Comment: social drinker; now not at all   . Drug use: None  . Sexual activity: None  Other Topics Concern  . None  Social History Narrative   Live alone;    Lives in single level home; one level    Moved here due to retirement x 1 year ago   Brand new house   Walk in tub and oversized shower     Vitals:   08/10/17 1025  BP: 130/82  Pulse: 78  Resp: 16  Temp: 98 F (36.7 C)  SpO2: 96%   Body mass index is 53.09 kg/m.    Wt Readings from Last 3 Encounters:  08/10/17 290 lb 4 oz (131.7 kg)  05/11/17 294 lb (133.4 kg)  04/25/17 299 lb (135.6 kg)     Physical Exam  Nursing note and vitals reviewed. Constitutional: She is oriented to person, place, and time. She appears well-developed. No distress.  HENT:  Head: Normocephalic and atraumatic.  Mouth/Throat: Oropharynx is clear and moist and mucous membranes are normal.  Eyes: Conjunctivae are normal. Pupils are equal, round, and reactive to light.  Neck: No tracheal deviation present. No thyroid mass and no thyromegaly present.  Cardiovascular: Normal rate and regular rhythm.  Occasional extrasystoles are present.  Murmur (Soft SEM  RUSB) heard. Pulses:      Dorsalis pedis pulses are 2+ on the right side, and 2+ on the left side.  Respiratory: Effort normal and breath sounds normal. No respiratory distress.  GI: Soft. She exhibits no mass. There is no hepatomegaly. There is no tenderness.  Musculoskeletal: She exhibits edema (Trace pitting LE edema, pedal edema L>R 2+ pitting.). She exhibits no tenderness.  Lymphadenopathy:    She has no cervical adenopathy.  Neurological: She is alert and oriented to person, place, and time. She has normal strength.  Stable gain, not assisted.  Skin: Skin is warm. No rash noted. No erythema.  Psychiatric: She has a normal mood and affect.  Well groomed, good eye contact.    ASSESSMENT AND PLAN:   [name] was seen today for  follow-up.  Diagnoses and all orders for this visit:  Lab Results  Component Value Date   CHOL 222 (H) 08/10/2017   HDL 62.20 08/10/2017   LDLCALC 137 (H) 08/10/2017   TRIG 114.0 08/10/2017   CHOLHDL 4 08/10/2017   Lab Results  Component Value Date   HGBA1C 5.8 08/10/2017   Lab Results  Component Value Date   CREATININE 0.79 08/10/2017   BUN 13 08/10/2017   NA 140 08/10/2017   K 4.0 08/10/2017   CL 101 08/10/2017   CO2 30 08/10/2017    IFG (impaired fasting glucose)  Metformin discontinued. Further recommendations will be given according to HgA1C results.  -     Hemoglobin A1c  Essential hypertension, benign  Adequately controlled. No changes in current management. DASH diet recommended. Eye exam recommended annually. F/U in 6 months, before if needed.  -     Basic metabolic  panel  Acute deep vein thrombosis (DVT) of left tibial vein ([place])  She has a few Xarelto left, so recommend discontinuing after she runs out. Educated about preventive measures. Consider Aspirin 325 mg if prolonged travel or fly. Instructed about warning signs.  Other polyneuropathy  Improved. Tolerating Gabapentin well. Increase Gabapentin from 300 mg to 600 mg,she still has 100 mg at home,so will titrate up dose as tolerated. Some side effects discussed. F/U in 3-4 months.  -     gabapentin (NEURONTIN) 300 MG capsule; Take 2 capsules (600 mg total) by mouth at bedtime.  Hypercalcemia  Mild. Further recommendations will be given according to lab results.  -     VITAMIN D 25 Hydroxy (Vit-D Deficiency, Fractures)  Bilateral lower extremity edema  Discussed possible etiologies, most likely related to vein disease. Compression stocking recommended. Wt loss may help. Vascular referral placed as requested.  -     Ambulatory referral to Vascular Surgery  Hyperlipidemia, unspecified hyperlipidemia type  No changes in current management, will follow labs done today and will  give further recommendations accordingly. F/U in 6-12 months.  The 10-year ASCVD risk score Tina Villanueva DC Brooke Bonito., et al., 2013) is: 10.8%   Values used to calculate the score:     Age: 47 years     Sex: Female     Is Non-Hispanic African American: Yes     Diabetic: No     Tobacco smoker: No     Systolic Blood Pressure: 416 mmHg     Is BP treated: Yes     HDL Cholesterol: 62.2 mg/dL     Total Cholesterol: 222 mg/dL  -     Lipid panel    -[name] was advised to return sooner than planned today if new concerns arise.       Betty G. Martinique, MD  [facility] [place] office.

## 2017-08-10 ENCOUNTER — Encounter: Payer: Self-pay | Admitting: Family Medicine

## 2017-08-10 ENCOUNTER — Ambulatory Visit (INDEPENDENT_AMBULATORY_CARE_PROVIDER_SITE_OTHER): Payer: Medicare Other | Admitting: Family Medicine

## 2017-08-10 VITALS — BP 130/82 | HR 78 | Temp 98.0°F | Resp 16 | Ht 62.0 in | Wt 290.2 lb

## 2017-08-10 DIAGNOSIS — I10 Essential (primary) hypertension: Secondary | ICD-10-CM | POA: Diagnosis not present

## 2017-08-10 DIAGNOSIS — E785 Hyperlipidemia, unspecified: Secondary | ICD-10-CM | POA: Diagnosis not present

## 2017-08-10 DIAGNOSIS — R600 Localized edema: Secondary | ICD-10-CM

## 2017-08-10 DIAGNOSIS — G6289 Other specified polyneuropathies: Secondary | ICD-10-CM | POA: Diagnosis not present

## 2017-08-10 DIAGNOSIS — I82442 Acute embolism and thrombosis of left tibial vein: Secondary | ICD-10-CM

## 2017-08-10 DIAGNOSIS — R7301 Impaired fasting glucose: Secondary | ICD-10-CM

## 2017-08-10 LAB — BASIC METABOLIC PANEL
BUN: 13 mg/dL (ref 6–23)
CALCIUM: 9.3 mg/dL (ref 8.4–10.5)
CHLORIDE: 101 meq/L (ref 96–112)
CO2: 30 mEq/L (ref 19–32)
CREATININE: 0.79 mg/dL (ref 0.40–1.20)
GFR: 93.43 mL/min (ref >60.00)
Glucose, Bld: 96 mg/dL (ref 70–99)
Potassium: 4 mEq/L (ref 3.5–5.1)
Sodium: 140 mEq/L (ref 135–145)

## 2017-08-10 LAB — LIPID PANEL
CHOL/HDL RATIO: 4
Cholesterol: 222 mg/dL — ABNORMAL HIGH (ref 0–200)
HDL: 62.2 mg/dL (ref >39.00)
LDL Cholesterol: 137 mg/dL — ABNORMAL HIGH (ref 0–99)
NONHDL: 159.45
Triglycerides: 114 mg/dL (ref 0.0–149.0)
VLDL: 22.8 mg/dL (ref 0.0–40.0)

## 2017-08-10 LAB — HEMOGLOBIN A1C: HEMOGLOBIN A1C: 5.8 % (ref 4.6–6.5)

## 2017-08-10 LAB — VITAMIN D 25 HYDROXY (VIT D DEFICIENCY, FRACTURES): VITD: 25.95 ng/mL — AB (ref 30.00–100.00)

## 2017-08-10 MED ORDER — GABAPENTIN 300 MG PO CAPS: 600.0000 mg | ORAL_CAPSULE | Freq: Every day | ORAL | 3 refills | Status: DC

## 2017-08-10 NOTE — Patient Instructions (Addendum)
A few things to remember from today's visit:   Essential hypertension, benign - Plan: Basic metabolic panel  Acute deep vein thrombosis (DVT) of left tibial vein (HCC)  IFG (impaired fasting glucose) - Plan: Hemoglobin A1c  Other polyneuropathy - Plan: gabapentin (NEURONTIN) 300 MG capsule  Hypercalcemia - Plan: VITAMIN D 25 Hydroxy (Vit-D Deficiency, Fractures)  Bilateral lower extremity edema - Plan: Ambulatory referral to Vascular Surgery  Hyperlipidemia, unspecified hyperlipidemia type - Plan: Lipid panel  Gabapentin increased from 300 mg to 600 mg. Vein specialist appt to be arranged. Compression stocking may help. Tai Chi 3 times per week.    Please be sure medication list is accurate. If a new problem present, please set up appointment sooner than planned today.

## 2017-08-11 ENCOUNTER — Encounter: Payer: Self-pay | Admitting: Family Medicine

## 2017-09-03 ENCOUNTER — Telehealth: Payer: Self-pay | Admitting: Family Medicine

## 2017-09-03 ENCOUNTER — Other Ambulatory Visit: Payer: Self-pay | Admitting: Family Medicine

## 2017-09-03 MED ORDER — MELOXICAM 15 MG PO TABS: 15.0000 mg | ORAL_TABLET | Freq: Every day | ORAL | 1 refills | Status: DC | PRN

## 2017-09-03 NOTE — Telephone Encounter (Signed)
Copied from [place] [id number]. Topic: Quick Communication - See Telephone Encounter >> Sep 03, 2017 12:41 PM Bea Graff, NT wrote: CRM for notification. See Telephone encounter for: Pt would like a refill of meloxicam. She is no longer on xarelto. Uses [facility] on [address].   09/03/17.

## 2017-09-03 NOTE — Telephone Encounter (Signed)
Rx for Meloxicam 15 mg sent to her pharmacy.  Thanks, BJ

## 2017-09-03 NOTE — Telephone Encounter (Signed)
Sent to Dr. Jordan for review. 

## 2017-09-10 ENCOUNTER — Telehealth: Payer: Self-pay | Admitting: Family Medicine

## 2017-09-10 NOTE — Telephone Encounter (Signed)
Copied from [place] [id number]. Topic: Quick Communication - Rx Refill/Question >> Sep 10, 2017 11:59 AM Oliver Pila B wrote: Medication: metoprolol succinate (TOPROL-XL) 25 MG 24 hr tablet [[id number]] ENDED  Pt states blood pressure is fluctuating after being taken off of medicine, pt wants to know if she needs to go back on the Rx   Bp has been reading 125-152/70-78 contact pt to advise

## 2017-09-10 NOTE — Telephone Encounter (Signed)
Message sent to Dr. Jordan for review. 

## 2017-09-11 NOTE — Telephone Encounter (Signed)
Metoprolol was weaned off ,starting in 04/2017 and last OV 07/2017 she was already off. HTN well controlled last OV,when she was already off of Metoprolol. Metoprolol was discontinued because bradycardia. Meloxicam can contribute to BP elevation,so discontinue and continue monitoring BP. I do not recommend resuming Metoprolol,we can consider other options.  Thanks, BJ

## 2017-09-12 NOTE — Telephone Encounter (Signed)
Spoke with patient and she decided to stop Meloxicam and monitor blood pressure. She will come in on October 03, 2017 and at that time she will let us know how b/p is and then will determine what other medication options are available.

## 2017-10-03 ENCOUNTER — Encounter: Payer: Self-pay | Admitting: Family Medicine

## 2017-10-03 ENCOUNTER — Ambulatory Visit (INDEPENDENT_AMBULATORY_CARE_PROVIDER_SITE_OTHER): Payer: Medicare Other | Admitting: Family Medicine

## 2017-10-03 VITALS — BP 138/76 | HR 72 | Temp 97.7°F | Resp 12 | Ht 62.0 in | Wt 290.0 lb

## 2017-10-03 DIAGNOSIS — I10 Essential (primary) hypertension: Secondary | ICD-10-CM

## 2017-10-03 DIAGNOSIS — R600 Localized edema: Secondary | ICD-10-CM | POA: Diagnosis not present

## 2017-10-03 DIAGNOSIS — Z6841 Body Mass Index (BMI) 40.0 and over, adult: Secondary | ICD-10-CM | POA: Diagnosis not present

## 2017-10-03 MED ORDER — HYDROCHLOROTHIAZIDE 25 MG PO TABS: 25.0000 mg | ORAL_TABLET | Freq: Every day | ORAL | 0 refills | Status: DC

## 2017-10-03 NOTE — Assessment & Plan Note (Signed)
We discussed benefits of wt loss as well as adverse effects of obesity. Consistency with healthy diet and physical activity recommended.  

## 2017-10-03 NOTE — Progress Notes (Signed)
HPI:  Chief Complaint  Patient presents with  . Discuss medications    [name] is a 67 y.o. female, who is here today concerned about elevated BP's.  I saw her on 08/10/17 for F/U.  She is currently on Benicar HCT 40-25 mg daily and Procardia XL 90 mg daily. She was on Metoprolol but discontinued because bradycardia. BP is usually elevated in the morning: 130's-150's/90's.  Denies headache, visual changes, chest pain, dyspnea, palpitation, claudication, focal weakness, or edema.  Lab Results  Component Value Date   CREATININE 0.79 08/10/2017   BUN 13 08/10/2017   NA 140 08/10/2017   K 4.0 08/10/2017   CL 101 08/10/2017   CO2 30 08/10/2017     She has Hx of LE edema and lymphedema. Pending appt with vascular. She is not wearing compression stocking. Edema seems to be stable,she has not noted ulcers or vesicles.  Edema is exacerbated by prolonged standing and better in the morning but no resolved.  She is not exercising regularly due to knee OA. She is trying to follow a healthy diet.    Review of Systems  Constitutional: Negative for activity change, appetite change, fatigue, fever and unexpected weight change.  HENT: Negative for mouth sores, nosebleeds and trouble swallowing.   Eyes: Negative for redness and visual disturbance.  Respiratory: Negative for cough, shortness of breath and wheezing.   Cardiovascular: Positive for leg swelling. Negative for chest pain and palpitations.  Gastrointestinal: Negative for abdominal pain, nausea and vomiting.       Negative for changes in bowel habits.  Genitourinary: Negative for decreased urine volume, dysuria and hematuria.  Neurological: Negative for seizures, syncope, weakness, numbness and headaches.  Psychiatric/Behavioral: Negative for confusion. The patient is nervous/anxious.       Current Outpatient Medications on File Prior to Visit  Medication Sig Dispense Refill  . atorvastatin (LIPITOR)  40 MG tablet TAKE ONE TABLET BY MOUTH ONCE DAILY 90 tablet 1  . gabapentin (NEURONTIN) 300 MG capsule Take 2 capsules (600 mg total) by mouth at bedtime. 60 capsule 3  . NIFEdipine (PROCARDIA XL/ADALAT-CC) 90 MG 24 hr tablet Take 1 tablet (90 mg total) by mouth daily. 90 tablet 2  . olmesartan-hydrochlorothiazide (BENICAR HCT) 40-25 MG tablet Take 1 tablet by mouth daily. 90 tablet 1  . meloxicam (MOBIC) 15 MG tablet Take 1 tablet (15 mg total) by mouth daily as needed for pain. (Patient not taking: Reported on 10/03/2017) 30 tablet 1  . metoprolol succinate (TOPROL-XL) 25 MG 24 hr tablet 1/2 tab everh other day for 10 days then every 3 days for a week and stop 90 tablet 1   No current facility-administered medications on file prior to visit.      Past Medical History:  Diagnosis Date  . Arthritis   . Chicken pox   . Heart murmur   . Hyperlipidemia   . Hypertension    No Known Allergies  Social History   Socioeconomic History  . Marital status: Unknown    Spouse name: None  . Number of children: None  . Years of education: None  . Highest education level: None  Social Needs  . Financial resource strain: None  . Food insecurity - worry: None  . Food insecurity - inability: None  . Transportation needs - medical: None  . Transportation needs - non-medical: None  Occupational History  . None  Tobacco Use  . Smoking status: Former Smoker    Packs/day: 0.30  Years: 30.00    Pack years: 9.00  . Smokeless tobacco: Never Used  Substance and Sexual Activity  . Alcohol use: None    Comment: social drinker; now not at all   . Drug use: None  . Sexual activity: None  Other Topics Concern  . None  Social History Narrative   Live alone;    Lives in single level home; one level    Moved here due to retirement x 1 year ago   Brand new house   Walk in tub and oversized shower     Vitals:   10/03/17 1030  BP: 138/76  Pulse: 72  Resp: 12  Temp: 97.7 F (36.5 C)  SpO2:  98%   Body mass index is 53.04 kg/m.  Wt Readings from Last 3 Encounters:  10/03/17 290 lb (131.5 kg)  08/10/17 290 lb 4 oz (131.7 kg)  05/11/17 294 lb (133.4 kg)    Physical Exam  Nursing note and vitals reviewed. Constitutional: She is oriented to person, place, and time. She appears well-developed. No distress.  HENT:  Head: Normocephalic and atraumatic.  Mouth/Throat: Oropharynx is clear and moist and mucous membranes are normal.  Eyes: Conjunctivae are normal. Pupils are equal, round, and reactive to light.  Cardiovascular: Normal rate and regular rhythm.  Murmur (SEM I/VI LUSB,RUSB) heard. Pulses:      Dorsalis pedis pulses are 2+ on the right side, and 2+ on the left side.  Respiratory: Effort normal and breath sounds normal. No respiratory distress.  GI: Soft. She exhibits no mass. There is no tenderness.  Musculoskeletal: She exhibits edema (2+ pitting LE edema,bilateral.Pedal piting edema 2-3+ L>R.).  Lymphadenopathy:    She has no cervical adenopathy.  Neurological: She is alert and oriented to person, place, and time. She has normal strength. Coordination normal.  Stable gait.  Skin: Skin is warm. No erythema.  Psychiatric: Her mood appears anxious.  Well groomed, good eye contact.     ASSESSMENT AND PLAN:   [name] was seen today for medications follow-up.  Bilateral lower extremity edema Information about vein specialist given. Recommend compression stocking more often,HCTZ 25 mg added. Wt loss may also help.   Essential hypertension, benign Overall adequate controlled. Recommendations in regard to current guideline discussed. We discussed a few options, she is not interested in Hydralazine because frequency. HCTZ 25 mg at noon may help,she agrees with this option.Side effects discussed. K+ rich diet and low salt diet recommended. Possible complications of elevated BP discussed. She will continue monitoring BP at home. F/U in 3-4  months.   BMI 50.0-59.9, adult ([place]) We discussed benefits of wt loss as well as adverse effects of obesity. Consistency with healthy diet and physical activity recommended.      -[name] was advised to return sooner than planned today if new concerns arise.       Ernie Kasler G. Martinique, MD  [facility]. [place] office.

## 2017-10-03 NOTE — Assessment & Plan Note (Signed)
Overall adequate controlled. Recommendations in regard to current guideline discussed. We discussed a few options, she is not interested in Hydralazine because frequency. HCTZ 25 mg at noon may help,she agrees with this option.Side effects discussed. K+ rich diet and low salt diet recommended. Possible complications of elevated BP discussed. She will continue monitoring BP at home. F/U in 3-4 months.

## 2017-10-03 NOTE — Assessment & Plan Note (Signed)
Information about vein specialist given. Recommend compression stocking more often,HCTZ 25 mg added. Wt loss may also help.

## 2017-10-03 NOTE — Patient Instructions (Addendum)
A few things to remember from today's visit:   Essential hypertension, benign - Plan: hydrochlorothiazide (HYDRODIURIL) 25 MG tablet  Bilateral lower extremity edema - Plan: hydrochlorothiazide (HYDRODIURIL) 25 MG tablet   Please be sure medication list is accurate. If a new problem present, please set up appointment sooner than planned today.   Another HCTZ 25 mg at noon. Continue monitoring blood pressure. Low salt diet. Compression stocking.  Rest unchanged.  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

## 2017-10-05 ENCOUNTER — Other Ambulatory Visit: Payer: Self-pay

## 2017-10-05 DIAGNOSIS — R600 Localized edema: Secondary | ICD-10-CM

## 2017-10-16 ENCOUNTER — Other Ambulatory Visit: Payer: Self-pay | Admitting: Family Medicine

## 2017-10-16 DIAGNOSIS — I10 Essential (primary) hypertension: Secondary | ICD-10-CM

## 2017-10-18 ENCOUNTER — Telehealth: Payer: Self-pay | Admitting: Family Medicine

## 2017-10-18 ENCOUNTER — Telehealth: Payer: Self-pay | Admitting: *Deleted

## 2017-10-18 NOTE — Telephone Encounter (Signed)
Pharmacy requesting medication change for olmesartan-HCTZ 40-25 mg tab, 1 tab by mouth daily. Note from pharmacy: medication is on back order with no release date, could you change it to something else.

## 2017-10-18 NOTE — Telephone Encounter (Signed)
Spoke with patient and pharmacy. Pharmacy stated that 2 new separate rx's could be sent over for Losartan and [place].

## 2017-10-18 NOTE — Telephone Encounter (Signed)
Copied from [place] [id number]. Topic: Inquiry >> Oct 18, 2017  2:10 PM Pricilla Handler wrote: Reason for CRM: Patient states that [facility] can no longer obtain Olmesartan-hydrochlorothiazide (BENICAR HCT) 40-25 MG tablet. Patient will need a new medication to replace it. Someone from the office please call patient at [phone]. She will elaborate more once she is called.       Thank You!!!

## 2017-10-19 NOTE — Telephone Encounter (Signed)
She can change to a combination Losartan-HCTZ (100-25), Valsartan-HCTZ (320-25), or monotherapy Losartan OR Valsartan and HCTZ. She is also taking extra HCTZ 25 mg.  Thanks, BJ

## 2017-10-19 NOTE — Telephone Encounter (Signed)
I think I already addressed another message with similar request.  BJ

## 2017-10-22 ENCOUNTER — Telehealth: Payer: Self-pay | Admitting: Family Medicine

## 2017-10-22 ENCOUNTER — Other Ambulatory Visit: Payer: Self-pay | Admitting: *Deleted

## 2017-10-22 MED ORDER — AMLODIPINE-VALSARTAN-HCTZ 10-320-25 MG PO TABS: 10.0000 mg | ORAL_TABLET | Freq: Every day | ORAL | 2 refills | Status: DC

## 2017-10-22 NOTE — Telephone Encounter (Signed)
Pt called to check on status of bp med and say  She wants it sent to :  [place], [place] - [address] [phone] (Phone) [phone] (Fax)

## 2017-10-22 NOTE — Telephone Encounter (Signed)
Copied from [place] [id number]. Topic: Inquiry >> Oct 18, 2017  2:10 PM Pricilla Handler wrote: Reason for CRM: Patient states that [facility] can no longer obtain Olmesartan-hydrochlorothiazide (BENICAR HCT) 40-25 MG tablet. Patient will need a new medication to replace it. Someone from the office please call patient at [phone]. She will elaborate more once she is called.       Thank You!!!  >> Oct 22, 2017  4:08 PM Percell Belt A wrote: Pt called in and said that the pharmacy said that this med is going to need a PA.  Pharmacy should be faxing it over

## 2017-10-22 NOTE — Telephone Encounter (Signed)
Rx for Valsartan-HTCZ 320-25 mg sent to pharmacy.

## 2017-10-23 NOTE — Telephone Encounter (Signed)
New Rx was sent to pharmacy on 10/22/2017.

## 2017-10-24 ENCOUNTER — Other Ambulatory Visit: Payer: Self-pay | Admitting: *Deleted

## 2017-10-24 MED ORDER — LOSARTAN POTASSIUM-HCTZ 100-25 MG PO TABS: 1.0000 | ORAL_TABLET | Freq: Every day | ORAL | 1 refills | Status: DC

## 2017-10-24 NOTE — Telephone Encounter (Signed)
Copied from [place]. Topic: Quick Communication - Rx Refill/Question >> Oct 24, 2017 12:31 PM Carolyn Stare wrote: Medication amLODIPine-Valsartan-HCTZ 10-320-25 MG TABS     Pt spoke with insurance company and they will not cover the above medication even if PA approve it will cost more than she can afford.   Pt insurance company will cover LOSARTAN ,  IRBESARTAN   said both has HCTZ  Preferred [place]   Agent: Please be advised that RX refills may take up to 3 business days. We ask that you follow-up with your pharmacy.

## 2017-10-24 NOTE — Telephone Encounter (Signed)
Rx for Losartan-HCTZ sent to pharmacy.

## 2017-10-24 NOTE — Telephone Encounter (Signed)
Medication.... amLODIPine-Valsartan-HCTZ 10-320-25 MG TABS     Pt spoke with insurance company and they will not cover the above medication even if PA approves, it will cost more than she can afford.   Pt insurance company will cover LOSARTAN ,  IRBESARTAN.  Preferred [place]

## 2017-10-26 NOTE — Telephone Encounter (Signed)
Copied from [place]. Topic: Quick Communication - Rx Refill/Question >> Oct 24, 2017 12:31 PM Carolyn Stare wrote: Medication amLODIPine-Valsartan-HCTZ 10-320-25 MG TABS     Pt spoke with insurance company and they will not cover the above medication even if PA approve it will cost more than she can afford.   Pt insurance company will cover LOSARTAN ,  IRBESARTAN   said both has HCTZ  Preferred [place]   Agent: Please be advised that RX refills may take up to 3 business days. We ask that you follow-up with your pharmacy. >> Oct 25, 2017  5:00 PM [place], [place] A wrote: Pt called in back to check the status of this message.   INS does NOT cover the amlodipine.  They will ONLY over  Pt insurance company will cover LOSARTAN , IRBESARTAN  said both has [place]

## 2017-10-26 NOTE — Telephone Encounter (Signed)
Patient informed that losartan is ready for pick-up.

## 2017-10-31 ENCOUNTER — Other Ambulatory Visit: Payer: Self-pay | Admitting: *Deleted

## 2017-10-31 NOTE — Telephone Encounter (Signed)
Please update patient's medication list and remove the BP meds she is no longer taking and keep only the BP she is supposed to take. Please also call to cancel old rxs at pharmacy.

## 2017-11-05 NOTE — Telephone Encounter (Signed)
Medication list updated on 11/02/17.

## 2017-11-16 ENCOUNTER — Encounter: Payer: Self-pay | Admitting: Surgery

## 2017-11-16 ENCOUNTER — Ambulatory Visit (INDEPENDENT_AMBULATORY_CARE_PROVIDER_SITE_OTHER): Payer: Medicare Other | Admitting: Surgery

## 2017-11-16 ENCOUNTER — Other Ambulatory Visit: Payer: Self-pay

## 2017-11-16 ENCOUNTER — Ambulatory Visit (HOSPITAL_COMMUNITY)
Admission: RE | Admit: 2017-11-16 | Discharge: 2017-11-16 | Disposition: A | Discharge status: Home / Self Care | Payer: Medicare Other | Source: Ambulatory Visit | Attending: Vascular Surgery | Admitting: Vascular Surgery

## 2017-11-16 VITALS — BP 139/73 | HR 68 | Temp 97.3°F | Resp 16 | Ht 62.0 in | Wt 291.0 lb

## 2017-11-16 DIAGNOSIS — I872 Venous insufficiency (chronic) (peripheral): Secondary | ICD-10-CM | POA: Diagnosis not present

## 2017-11-16 DIAGNOSIS — R600 Localized edema: Secondary | ICD-10-CM | POA: Diagnosis present

## 2017-11-16 NOTE — Progress Notes (Signed)
[facility]  Patient name: Tina Villanueva MRN: [id number] DOB: 1951-01-21 Sex: female   REQUESTING PROVIDER:    Dr. Martinique   REASON FOR CONSULT:    Leg Swelling  HISTORY OF PRESENT ILLNESS:   Tina Villanueva is a 67 y.o. female, who is referred today for evaluation of leg swelling.  The patient states that she has chronic bilateral lower extremity swelling.  She had a DVT in August 2018 and her left posterior tibial veins.  Since that time she has had worsening swelling of the left leg.  She took Xarelto for 3 months and has come off of it.  The patient states that she will get tingling and numbness in her legs especially after walking for long distances.  Her walking is limited by the arthritis in her knees.  She does have restless leg syndrome and gets some cramping at night.  Her cramps are helped by taking pickle juice.  The patient takes a statin for hypercholesterolemia.  She takes Neurontin for peripheral neuropathy.  Her blood pressure is medically managed.  She denies having diabetes.  PAST MEDICAL HISTORY    Past Medical History:  Diagnosis Date  . Arthritis   . Chicken pox   . Heart murmur   . Hyperlipidemia   . Hypertension      FAMILY HISTORY   Family History  Problem Relation Age of Onset  . Arthritis Mother   . Hyperlipidemia Mother   . Hypertension Mother   . Stroke Mother   . Arthritis Father   . Hyperlipidemia Father   . Hypertension Father   . Stroke Father     SOCIAL HISTORY:   Social History   Socioeconomic History  . Marital status: Unknown    Spouse name: Not on file  . Number of children: Not on file  . Years of education: Not on file  . Highest education level: Not on file  Occupational History  . Not on file  Social Needs  . Financial resource strain: Not on file  . Food insecurity:    Worry: Not on file    Inability: Not on file  . Transportation needs:   Medical: Not on file    Non-medical: Not on file  Tobacco Use  . Smoking status: Former Smoker    Packs/day: 0.30    Years: 30.00    Pack years: 9.00  . Smokeless tobacco: Never Used  Substance and Sexual Activity  . Alcohol use: Not on file    Comment: social drinker; now not at all   . Drug use: Never  . Sexual activity: Not on file  Lifestyle  . Physical activity:    Days per week: Not on file    Minutes per session: Not on file  . Stress: Not on file  Relationships  . Social connections:    Talks on phone: Not on file    Gets together: Not on file    Attends religious service: Not on file    Active member of club or organization: Not on file    Attends meetings of clubs or organizations: Not on file    Relationship status: Not on file  . Intimate partner violence:    Fear of current or ex partner: Not on file    Emotionally abused: Not on file    Physically abused: Not on file    Forced sexual activity: Not on file  Other Topics Concern  . Not on file  Social  History Narrative   Live alone;    Lives in single level home; one level    Moved here due to retirement x 1 year ago   Brand new house   Walk in tub and oversized shower     ALLERGIES:    No Known Allergies  CURRENT MEDICATIONS:    Current Outpatient Medications  Medication Sig Dispense Refill  . aspirin EC 81 MG tablet Take 81 mg by mouth daily.    Marland Kitchen atorvastatin (LIPITOR) 40 MG tablet TAKE ONE TABLET BY MOUTH ONCE DAILY 90 tablet 1  . Cyanocobalamin (VITAMIN B 12 PO) Take by mouth.    . gabapentin (NEURONTIN) 300 MG capsule Take 2 capsules (600 mg total) by mouth at bedtime. 60 capsule 3  . hydrochlorothiazide (HYDRODIURIL) 25 MG tablet Take 1 tablet (25 mg total) by mouth daily. 90 tablet 0  . losartan-hydrochlorothiazide (HYZAAR) 100-25 MG tablet Take 1 tablet by mouth daily. 90 tablet 1  . meloxicam (MOBIC) 15 MG tablet Take 1 tablet (15 mg total) by mouth daily as needed for pain. 30 tablet 1    . NIFEdipine (PROCARDIA XL/ADALAT-CC) 90 MG 24 hr tablet TAKE ONE TABLET BY MOUTH ONCE DAILY 90 tablet 2  . metoprolol succinate (TOPROL-XL) 25 MG 24 hr tablet 1/2 tab everh other day for 10 days then every 3 days for a week and stop 90 tablet 1   No current facility-administered medications for this visit.     REVIEW OF SYSTEMS:   [X]  denotes positive finding, [ ]  denotes negative finding Cardiac  Comments:  Chest pain or chest pressure:    Shortness of breath upon exertion:    Short of breath when lying flat:    Irregular heart rhythm:        Vascular    Pain in calf, thigh, or hip brought on by ambulation:    Pain in feet at night that wakes you up from your sleep:  x   Blood clot in your veins: x   Leg swelling:  x       Pulmonary    Oxygen at home:    Productive cough:     Wheezing:         Neurologic    Sudden weakness in arms or legs:     Sudden numbness in arms or legs:     Sudden onset of difficulty speaking or slurred speech:    Temporary loss of vision in one eye:     Problems with dizziness:         Gastrointestinal    Blood in stool:      Vomited blood:         Genitourinary    Burning when urinating:     Blood in urine:        Psychiatric    Major depression:         Hematologic    Bleeding problems:    Problems with blood clotting too easily: x       Skin    Rashes or ulcers:        Constitutional    Fever or chills:     PHYSICAL EXAM:   Vitals:   11/16/17 1204  BP: 139/73  Pulse: 68  Resp: 16  Temp: (!) 97.3 F (36.3 C)  TempSrc: Oral  SpO2: 99%  Weight: 291 lb (132 kg)  Height: 5\' 2"  (1.575 m)    GENERAL: The patient is a well-nourished female, in no acute distress.  The vital signs are documented above.  VASCULAR: Palpable pedal pulses.  2+ pitting edema bilaterally, left greater than right PULMONARY: Nonlabored respirations MUSCULOSKELETAL: There are no major deformities or cyanosis. NEUROLOGIC: No focal weakness or  paresthesias are detected. SKIN: There are no ulcers or rashes noted. PSYCHIATRIC: The patient has a normal affect.  STUDIES:   I have reviewed her venous reflux study which showed no evidence of DVT.  There is no superficial reflux.  There is reflux in the superficial venous system  ASSESSMENT and PLAN   Leg swelling: This is secondary to deep venous reflux.  Her DVT has resolved on ultrasound today.  We discussed the importance of leg elevation and compression stockings to help minimize chronic issues in the future.  We also discussed the importance of weight loss.  She was given information regarding how to obtain compression stockings and how to wear them.  All of her questions were answered today.  I will see her back on an as-needed basis.  She will contact me with any additional questions.     Annamarie Major, MD [facility] [phone] Pager [phone]

## 2017-12-02 NOTE — Progress Notes (Addendum)
[name] is a 67 y.o.female, who is here today to follow on HTN,LE edema,and HLD.  Currently she is on Losartan HCTZ 40-25 mg , HCTZ 25 mg daily. HCTZ increased last OV, 10/03/17. Also on Procardia XL 90 mg daily. Home BP readings: 120-130s/70s She istaking medications as instructed, no side effects reported.  She has not noted unusual headache, visual changes, exertional chest pain, dyspnea,  focal weakness, or worsening edema.    Lab Results  Component Value Date   CREATININE 0.79 08/10/2017   BUN 13 08/10/2017   NA 140 08/10/2017   K 4.0 08/10/2017   CL 101 08/10/2017   CO2 30 08/10/2017    History of OA, mainly localized in the knees.  She is taking meloxicam 15 daily as needed, it helps with pain.  Lower extremity edema improved some with adding HCTZ, she is also wearing compression stockings as recommended by vein specialist. No skin lesions.  Hyperlipidemia: Currently she is on atorvastatin 40 mg daily. She tries to follow a low-fat diet as well. Last lipid panel in 07/2017: HDL 62, TC 222, TG 114, and LDL 137. She is tolerating medication well and is not reporting side effects.   IFG: She was on Metformin 500 mg twice daily, which we discontinued about 3-4 months ago. No abdominal pain, nausea,vomiting, polydipsia,polyuria, or polyphagia.   Review of Systems  Constitutional: Negative for activity change, appetite change, fatigue and fever.  HENT: Negative for mouth sores, nosebleeds and trouble swallowing.   Eyes: Negative for redness and visual disturbance.  Respiratory: Negative for cough, shortness of breath and wheezing.   Cardiovascular: Negative for chest pain, palpitations and leg swelling.  Gastrointestinal: Negative for abdominal pain, nausea and vomiting.       Negative for changes in bowel habits.  Endocrine: Negative for polydipsia, polyphagia and polyuria.  Genitourinary: Negative for decreased urine volume, dysuria and hematuria.    Musculoskeletal: Positive for arthralgias. Negative for myalgias.  Skin: Negative for rash and wound.  Neurological: Negative for syncope, weakness and headaches.     Current Outpatient Medications on File Prior to Visit  Medication Sig Dispense Refill  . aspirin 325 MG tablet Take 325 mg by mouth daily.    Marland Kitchen atorvastatin (LIPITOR) 40 MG tablet TAKE ONE TABLET BY MOUTH ONCE DAILY 90 tablet 1  . Cyanocobalamin (VITAMIN B 12 PO) Take by mouth.    . gabapentin (NEURONTIN) 300 MG capsule Take 2 capsules (600 mg total) by mouth at bedtime. 60 capsule 3  . hydrochlorothiazide (HYDRODIURIL) 25 MG tablet Take 1 tablet (25 mg total) by mouth daily. 90 tablet 0  . losartan-hydrochlorothiazide (HYZAAR) 100-25 MG tablet Take 1 tablet by mouth daily. 90 tablet 1  . meloxicam (MOBIC) 15 MG tablet Take 1 tablet (15 mg total) by mouth daily as needed for pain. 30 tablet 1  . NIFEdipine (PROCARDIA XL/ADALAT-CC) 90 MG 24 hr tablet TAKE ONE TABLET BY MOUTH ONCE DAILY 90 tablet 2   No current facility-administered medications on file prior to visit.      Past Medical History:  Diagnosis Date  . Arthritis   . Chicken pox   . Heart murmur   . Hyperlipidemia   . Hypertension     No Known Allergies  Social History   Socioeconomic History  . Marital status: Unknown    Spouse name: Not on file  . Number of children: Not on file  . Years of education: Not on file  . Highest education  level: Not on file  Occupational History  . Not on file  Social Needs  . Financial resource strain: Not on file  . Food insecurity:    Worry: Not on file    Inability: Not on file  . Transportation needs:    Medical: Not on file    Non-medical: Not on file  Tobacco Use  . Smoking status: Former Smoker    Packs/day: 0.30    Years: 30.00    Pack years: 9.00  . Smokeless tobacco: Never Used  Substance and Sexual Activity  . Alcohol use: Not on file    Comment: social drinker; now not at all   . Drug use:  Never  . Sexual activity: Not on file  Lifestyle  . Physical activity:    Days per week: Not on file    Minutes per session: Not on file  . Stress: Not on file  Relationships  . Social connections:    Talks on phone: Not on file    Gets together: Not on file    Attends religious service: Not on file    Active member of club or organization: Not on file    Attends meetings of clubs or organizations: Not on file    Relationship status: Not on file  Other Topics Concern  . Not on file  Social History Narrative   Live alone;    Lives in single level home; one level    Moved here due to retirement x 1 year ago   Brand new house   Walk in tub and oversized shower     Vitals:   12/03/17 1012  BP: 126/82  Pulse: 77  Temp: 97.9 F (36.6 C)  SpO2: 95%   Body mass index is 53.96 kg/m.  Wt Readings from Last 3 Encounters:  12/03/17 295 lb (133.8 kg)  11/16/17 291 lb (132 kg)  10/03/17 290 lb (131.5 kg)     Physical Exam  Nursing note and vitals reviewed. Constitutional: She is oriented to person, place, and time. She appears well-developed. No distress.  HENT:  Head: Normocephalic and atraumatic.  Mouth/Throat: Oropharynx is clear and moist and mucous membranes are normal.  Eyes: Pupils are equal, round, and reactive to light. Conjunctivae are normal.  Cardiovascular: Normal rate and regular rhythm.  No murmur heard. Pulses:      Dorsalis pedis pulses are 2+ on the right side, and 2+ on the left side.  Respiratory: Effort normal and breath sounds normal. No respiratory distress.  GI: Soft. She exhibits no mass. There is no tenderness.  Musculoskeletal: She exhibits edema (1+ pitting LE edema,bilateral.). She exhibits no tenderness.  Lymphadenopathy:    She has no cervical adenopathy.  Neurological: She is alert and oriented to person, place, and time. She has normal strength. Coordination normal.  Skin: Skin is warm. No erythema.  Psychiatric: She has a normal mood  and affect.  Well groomed, good eye contact.    ASSESSMENT AND PLAN:   [name] was seen today for follow-up.  Orders Placed This Encounter  Procedures  . Hemoglobin A1c  . Lipid panel  . Basic metabolic panel    Lab Results  Component Value Date   HGBA1C 5.8 12/03/2017   Lab Results  Component Value Date   CHOL 197 12/03/2017   HDL 65.90 12/03/2017   LDLCALC 110 (H) 12/03/2017   TRIG 105.0 12/03/2017   CHOLHDL 3 12/03/2017   Lab Results  Component Value Date   CREATININE 0.80  12/03/2017   BUN 13 12/03/2017   NA 141 12/03/2017   K 4.2 12/03/2017   CL 101 12/03/2017   CO2 32 12/03/2017    IFG (impaired fasting glucose)  Further recommendations will be given according to hemoglobin A1c results. Continue healthy lifestyle for primary prevention.  -     Hemoglobin A1c   Bilateral primary osteoarthritis of knee  Stable overall and Mobic helping with pain. No changes in current management. Some side effects of chronic NSAIDs use were discussed. Follow-up in 6 months.  Hyperlipidemia, unspecified hyperlipidemia type  No changes in Lipitor 40 mg daily, dose will be adjusted if needed depending of lab results. Continue low-fat diet. Regular physical activity as tolerated. Follow-up in 6-12 months.  -     Lipid panel   Essential hypertension, benign Adequately controlled. No changes in current management. DASH -Low salt diet to continue. Eye exam recommended annually, she is planning on arranging appointment. F/U in 6 months, before if needed.      -[name] advised to return sooner than planned today if new concerns arise.     Tina G. Martinique, MD  [facility]. [place] office.

## 2017-12-03 ENCOUNTER — Encounter: Payer: Self-pay | Admitting: Family Medicine

## 2017-12-03 ENCOUNTER — Ambulatory Visit (INDEPENDENT_AMBULATORY_CARE_PROVIDER_SITE_OTHER): Payer: Medicare Other | Admitting: Family Medicine

## 2017-12-03 VITALS — BP 126/82 | HR 77 | Temp 97.9°F | Ht 62.0 in | Wt 295.0 lb

## 2017-12-03 DIAGNOSIS — M170 Bilateral primary osteoarthritis of knee: Secondary | ICD-10-CM | POA: Diagnosis not present

## 2017-12-03 DIAGNOSIS — R7301 Impaired fasting glucose: Secondary | ICD-10-CM

## 2017-12-03 DIAGNOSIS — E785 Hyperlipidemia, unspecified: Secondary | ICD-10-CM

## 2017-12-03 DIAGNOSIS — I10 Essential (primary) hypertension: Secondary | ICD-10-CM

## 2017-12-03 LAB — BASIC METABOLIC PANEL
BUN: 13 mg/dL (ref 6–23)
CHLORIDE: 101 meq/L (ref 96–112)
CO2: 32 mEq/L (ref 19–32)
Calcium: 9.6 mg/dL (ref 8.4–10.5)
Creatinine, Ser: 0.8 mg/dL (ref 0.40–1.20)
GFR: 92 mL/min (ref >60.00)
GLUCOSE: 113 mg/dL — AB (ref 70–99)
Potassium: 4.2 mEq/L (ref 3.5–5.1)
Sodium: 141 mEq/L (ref 135–145)

## 2017-12-03 LAB — LIPID PANEL
CHOLESTEROL: 197 mg/dL (ref 0–200)
HDL: 65.9 mg/dL (ref >39.00)
LDL Cholesterol: 110 mg/dL — ABNORMAL HIGH (ref 0–99)
NonHDL: 130.7
TRIGLYCERIDES: 105 mg/dL (ref 0.0–149.0)
Total CHOL/HDL Ratio: 3
VLDL: 21 mg/dL (ref 0.0–40.0)

## 2017-12-03 LAB — HEMOGLOBIN A1C: Hgb A1c MFr Bld: 5.8 % (ref 4.6–6.5)

## 2017-12-03 NOTE — Assessment & Plan Note (Signed)
Adequately controlled. No changes in current management. DASH -Low salt diet to continue. Eye exam recommended annually, she is planning on arranging appointment. F/U in 6 months, before if needed.

## 2017-12-03 NOTE — Patient Instructions (Signed)
A few things to remember from today's visit:   IFG (impaired fasting glucose) - Plan: Hemoglobin A1c  Essential hypertension, benign - Plan: Basic metabolic panel  Bilateral primary osteoarthritis of knee  Hyperlipidemia, unspecified hyperlipidemia type - Plan: Lipid panel  No changes in medications today. Depending of hemoglobin A1c we will resume forming.  Please be sure medication list is accurate. If a new problem present, please set up appointment sooner than planned today.

## 2018-01-03 DIAGNOSIS — H40013 Open angle with borderline findings, low risk, bilateral: Secondary | ICD-10-CM | POA: Diagnosis not present

## 2018-01-03 DIAGNOSIS — H25813 Combined forms of age-related cataract, bilateral: Secondary | ICD-10-CM | POA: Diagnosis not present

## 2018-01-24 ENCOUNTER — Other Ambulatory Visit: Payer: Self-pay | Admitting: Family Medicine

## 2018-01-24 DIAGNOSIS — R600 Localized edema: Secondary | ICD-10-CM

## 2018-01-24 DIAGNOSIS — I10 Essential (primary) hypertension: Secondary | ICD-10-CM

## 2018-01-24 DIAGNOSIS — G6289 Other specified polyneuropathies: Secondary | ICD-10-CM

## 2018-01-24 MED ORDER — HYDROCHLOROTHIAZIDE 25 MG PO TABS: 25.0000 mg | ORAL_TABLET | Freq: Every day | ORAL | 1 refills | Status: DC

## 2018-01-24 MED ORDER — ATORVASTATIN CALCIUM 40 MG PO TABS: 40.0000 mg | ORAL_TABLET | Freq: Every day | ORAL | 1 refills | Status: DC

## 2018-01-24 NOTE — Telephone Encounter (Signed)
Refills of Atorvastatin and Hydrochlorothiazide refilled and sent to requested pharmacy. [facility] [address] also contacted to retrieve refills of Proc Fletcher Anon that was sent previously to [facility] on [address].  Pt still needs refills on the following:  Meloxicam refill Last Refill:09/03/17 #30 with 1 refill  Gabapentin refill Last Refill:08/10/17 # 60 3 refills  Last OV: 12/03/17 PCP: Dr. Martinique Pharmacy: [facility]

## 2018-01-24 NOTE — Telephone Encounter (Signed)
Copied from [place] [id number]. Topic: Quick Communication - Rx Refill/Question >> Jan 24, 2018 11:21 AM Boyd Kerbs wrote: Medication:  Pt. Wants all prescriptions sent to different [facility].  Put in correct one.   atorvastatin (LIPITOR) 40 MG tablet NIFEdipine (PROCARDIA XL/ADALAT-CC) 90 MG 24 hr tablet hydrochlorothiazide (HYDRODIURIL) 25 MG tablet meloxicam (MOBIC) 15 MG tablet gabapentin (NEURONTIN) 300 MG capsule  Has the patient contacted their pharmacy? No. (Agent: If no, request that the patient contact the pharmacy for the refill.) (Agent: If yes, when and what did the pharmacy advise?)  Preferred Pharmacy (with phone number or street name):   [place], [place] - [address] [place] [address] Phone: [phone] Fax: [phone]    Agent: Please be advised that RX refills may take up to 3 business days. We ask that you follow-up with your pharmacy.

## 2018-01-25 MED ORDER — MELOXICAM 15 MG PO TABS: 15.0000 mg | ORAL_TABLET | Freq: Every day | ORAL | 0 refills | Status: DC | PRN

## 2018-01-25 MED ORDER — GABAPENTIN 300 MG PO CAPS: 600.0000 mg | ORAL_CAPSULE | Freq: Every day | ORAL | 3 refills | Status: DC

## 2018-03-04 DIAGNOSIS — M1712 Unilateral primary osteoarthritis, left knee: Secondary | ICD-10-CM | POA: Diagnosis not present

## 2018-03-04 DIAGNOSIS — M25562 Pain in left knee: Secondary | ICD-10-CM | POA: Diagnosis not present

## 2018-03-11 DIAGNOSIS — M1712 Unilateral primary osteoarthritis, left knee: Secondary | ICD-10-CM | POA: Diagnosis not present

## 2018-03-11 DIAGNOSIS — M25562 Pain in left knee: Secondary | ICD-10-CM | POA: Diagnosis not present

## 2018-03-18 DIAGNOSIS — M25562 Pain in left knee: Secondary | ICD-10-CM | POA: Diagnosis not present

## 2018-03-18 DIAGNOSIS — M791 Myalgia, unspecified site: Secondary | ICD-10-CM | POA: Diagnosis not present

## 2018-03-18 DIAGNOSIS — M5127 Other intervertebral disc displacement, lumbosacral region: Secondary | ICD-10-CM | POA: Diagnosis not present

## 2018-03-18 DIAGNOSIS — M1712 Unilateral primary osteoarthritis, left knee: Secondary | ICD-10-CM | POA: Diagnosis not present

## 2018-04-01 ENCOUNTER — Other Ambulatory Visit: Payer: Self-pay | Admitting: Family Medicine

## 2018-04-01 ENCOUNTER — Other Ambulatory Visit: Payer: Self-pay

## 2018-04-17 ENCOUNTER — Other Ambulatory Visit: Payer: Self-pay | Admitting: Family Medicine

## 2018-06-04 ENCOUNTER — Encounter: Payer: Self-pay | Admitting: Family Medicine

## 2018-06-04 ENCOUNTER — Ambulatory Visit: Payer: Medicare Other | Admitting: Family Medicine

## 2018-06-04 VITALS — BP 124/84 | HR 71 | Temp 97.8°F | Resp 16 | Ht 62.0 in | Wt 302.1 lb

## 2018-06-04 DIAGNOSIS — I10 Essential (primary) hypertension: Secondary | ICD-10-CM | POA: Diagnosis not present

## 2018-06-04 DIAGNOSIS — R600 Localized edema: Secondary | ICD-10-CM

## 2018-06-04 DIAGNOSIS — Z1239 Encounter for other screening for malignant neoplasm of breast: Secondary | ICD-10-CM

## 2018-06-04 DIAGNOSIS — Y92009 Unspecified place in unspecified non-institutional (private) residence as the place of occurrence of the external cause: Secondary | ICD-10-CM | POA: Diagnosis not present

## 2018-06-04 DIAGNOSIS — W19XXXA Unspecified fall, initial encounter: Secondary | ICD-10-CM

## 2018-06-04 DIAGNOSIS — R7301 Impaired fasting glucose: Secondary | ICD-10-CM | POA: Diagnosis not present

## 2018-06-04 DIAGNOSIS — Z6841 Body Mass Index (BMI) 40.0 and over, adult: Secondary | ICD-10-CM

## 2018-06-04 DIAGNOSIS — Z23 Encounter for immunization: Secondary | ICD-10-CM

## 2018-06-04 DIAGNOSIS — G6289 Other specified polyneuropathies: Secondary | ICD-10-CM

## 2018-06-04 LAB — BASIC METABOLIC PANEL
BUN: 15 mg/dL (ref 6–23)
CO2: 32 mEq/L (ref 19–32)
Calcium: 9.7 mg/dL (ref 8.4–10.5)
Chloride: 99 mEq/L (ref 96–112)
Creatinine, Ser: 0.82 mg/dL (ref 0.40–1.20)
GFR: 89.28 mL/min (ref >60.00)
Glucose, Bld: 122 mg/dL — ABNORMAL HIGH (ref 70–99)
POTASSIUM: 4.1 meq/L (ref 3.5–5.1)
Sodium: 140 mEq/L (ref 135–145)

## 2018-06-04 LAB — HEMOGLOBIN A1C: HEMOGLOBIN A1C: 5.8 % (ref 4.6–6.5)

## 2018-06-04 LAB — TSH: TSH: 1.86 u[IU]/mL (ref 0.35–4.50)

## 2018-06-04 NOTE — Patient Instructions (Signed)
A few things to remember from today's visit:   IFG (impaired fasting glucose) - Plan: Hemoglobin A1c  Essential hypertension, benign - Plan: Basic metabolic panel, TSH  Bilateral lower extremity edema  Other polyneuropathy  Breast cancer screening - Plan: MM 3D SCREEN BREAST BILATERAL   Please be sure medication list is accurate. If a new problem present, please set up appointment sooner than planned today.

## 2018-06-04 NOTE — Progress Notes (Signed)
HPI:   Tina Villanueva is a 67 y.o. female, who is here today for 6 months follow up.   She was last seen on 12/03/17.  Hypertension: Currently she is on losartan-HCTZ 100-25 mg daily, HCTZ 25 mg in the afternoon, and Procardia XL 90 mg daily.  Lab Results  Component Value Date   CREATININE 0.80 12/03/2017   BUN 13 12/03/2017   NA 141 12/03/2017   K 4.2 12/03/2017   CL 101 12/03/2017   CO2 32 12/03/2017   Denies severe/frequent headache, visual changes, chest pain, dyspnea, palpitation, claudication, focal weakness, or worsening LE edema.  Generalized OA, mainly knee: Currently she is on Mobic 15 mg daily as needed. She is tolerating medication well,she does not take it daily.  Pain is exacerbated by prolonged walking,standing,going up and down stairs.  Peripheral neuropathy, she is on gabapentin 600 mg at bedtime.  Gabapentin has helped with symptoms, feet burning sensation.  IFG:  Lab Results  Component Value Date   HGBA1C 5.8 12/03/2017  She is not longer on Metformin.  She is trying to follow a healthy diet.   Vitamin D deficiency: Currently she is on OTC vitamin D 2000 units daily. Last 25 OH vitamin D on 08/10/2017 was low at 25.95.  Since her last OV she had a fall at home, 3 weeks ago. She slid when she was trying to get up from chair. Not able to get up,her daughter sent somebody to help her get up. Unharmed.  Today she is requesting thyroid function check.States that she is "always" cold. Negative for changes in bowel habits,fatigue,or wt changes.  Review of Systems  Constitutional: Negative for activity change, appetite change, fatigue and fever.  HENT: Negative for mouth sores, nosebleeds, sore throat and trouble swallowing.   Eyes: Negative for redness and visual disturbance.  Respiratory: Negative for cough, shortness of breath and wheezing.   Cardiovascular: Positive for leg swelling. Negative for chest pain and palpitations.    Gastrointestinal: Negative for abdominal pain, nausea and vomiting.       Negative for changes in bowel habits.  Endocrine: Positive for cold intolerance. Negative for heat intolerance, polydipsia, polyphagia and polyuria.  Genitourinary: Negative for decreased urine volume, dysuria and hematuria.  Musculoskeletal: Positive for arthralgias, back pain and gait problem.  Skin: Negative for rash and wound.  Neurological: Negative for syncope, weakness and headaches.    Current Outpatient Medications on File Prior to Visit  Medication Sig Dispense Refill  . aspirin 325 MG tablet Take 325 mg by mouth daily.    . Cyanocobalamin (VITAMIN B 12 PO) Take by mouth.    . hydrochlorothiazide (HYDRODIURIL) 25 MG tablet Take 1 tablet (25 mg total) by mouth daily. 90 tablet 1  . losartan-hydrochlorothiazide (HYZAAR) 100-25 MG tablet TAKE 1 TABLET BY MOUTH ONCE DAILY 90 tablet 3  . meloxicam (MOBIC) 15 MG tablet TAKE 1 TABLET BY MOUTH ONCE DAILY AS NEEDED FOR PAIN 90 tablet 0   No current facility-administered medications on file prior to visit.      Past Medical History:  Diagnosis Date  . Arthritis   . Chicken pox   . Heart murmur   . Hyperlipidemia   . Hypertension    No Known Allergies  Social History   Socioeconomic History  . Marital status: Unknown    Spouse name: Not on file  . Number of children: Not on file  . Years of education: Not on file  . Highest education level:  Not on file  Occupational History  . Not on file  Social Needs  . Financial resource strain: Not on file  . Food insecurity:    Worry: Not on file    Inability: Not on file  . Transportation needs:    Medical: Not on file    Non-medical: Not on file  Tobacco Use  . Smoking status: Former Smoker    Packs/day: 0.30    Years: 30.00    Pack years: 9.00  . Smokeless tobacco: Never Used  Substance and Sexual Activity  . Alcohol use: Not on file    Comment: social drinker; now not at all   . Drug use:  Never  . Sexual activity: Not on file  Lifestyle  . Physical activity:    Days per week: Not on file    Minutes per session: Not on file  . Stress: Not on file  Relationships  . Social connections:    Talks on phone: Not on file    Gets together: Not on file    Attends religious service: Not on file    Active member of club or organization: Not on file    Attends meetings of clubs or organizations: Not on file    Relationship status: Not on file  Other Topics Concern  . Not on file  Social History Narrative   Live alone;    Lives in single level home; one level    Moved here due to retirement x 1 year ago   Brand new house   Walk in tub and oversized shower     Vitals:   06/04/18 1032  BP: 124/84  Pulse: 71  Resp: 16  Temp: 97.8 F (36.6 C)  SpO2: 94%   Body mass index is 55.26 kg/m.  Wt Readings from Last 3 Encounters:  06/04/18 (!) 302 lb 2 oz (137 kg)  12/03/17 295 lb (133.8 kg)  11/16/17 291 lb (132 kg)     Physical Exam  Nursing note and vitals reviewed. Constitutional: She is oriented to person, place, and time. She appears well-developed. No distress.  HENT:  Head: Normocephalic and atraumatic.  Mouth/Throat: Oropharynx is clear and moist and mucous membranes are normal.  Eyes: Pupils are equal, round, and reactive to light. Conjunctivae are normal.  Cardiovascular: Normal rate and regular rhythm.  Murmur (SEM I/VI RUSB and LUSB) heard. Pulses:      Dorsalis pedis pulses are 2+ on the right side, and 2+ on the left side.  Respiratory: Effort normal and breath sounds normal. No respiratory distress.  GI: Soft. She exhibits no mass. There is no tenderness.  Musculoskeletal: She exhibits edema (1-2+ LE pitting edema , L>R).  Lymphadenopathy:    She has no cervical adenopathy.  Neurological: She is alert and oriented to person, place, and time. She has normal strength. No cranial nerve deficit. Gait normal.  Skin: Skin is warm. No rash noted. No  erythema.  Psychiatric: She has a normal mood and affect.  Well groomed, good eye contact.     ASSESSMENT AND PLAN:   Tina Villanueva was seen today for 6 months follow-up.  Orders Placed This Encounter  Procedures  . MM 3D SCREEN BREAST BILATERAL  . Flu vaccine HIGH DOSE PF  . Basic metabolic panel  . TSH  . Hemoglobin A1c   Lab Results  Component Value Date   HGBA1C 5.8 06/04/2018   Lab Results  Component Value Date   TSH 1.86 06/04/2018   Lab  Results  Component Value Date   CREATININE 0.82 06/04/2018   BUN 15 06/04/2018   NA 140 06/04/2018   K 4.1 06/04/2018   CL 99 06/04/2018   CO2 32 06/04/2018     IFG (impaired fasting glucose)  Healthy life style for primary prevention. Further recommendations will be given according to HgA1C results.  -     Hemoglobin A1c  Essential hypertension, benign  Adequately controlled. No changes in current management. DASH-low salt diet recommended. Eye exam recommended annually. F/U in 6 months, before if needed.  -     Basic metabolic panel -     TSH -     NIFEdipine (PROCARDIA XL/NIFEDICAL-XL) 90 MG 24 hr tablet; Take 1 tablet (90 mg total) by mouth daily.  Bilateral lower extremity edema  Stable. Continue wearing compression stocking. Good skin care and avoidance of trauma.   Other polyneuropathy  Well controlled with Gabapentin. No changes in current management. F/U in 6 months.  -     gabapentin (NEURONTIN) 300 MG capsule; Take 2 capsules (600 mg total) by mouth at bedtime.  Breast cancer screening -     MM 3D SCREEN BREAST BILATERAL; Future  Fall in home, initial encounter  Fall precautions discussed. She is not interested in PT for now.  Morbid obesity with BMI of 50.0-59.9, adult ([place])  Gained about 7 Lb since her last visit in 11/2017. We discussed benefits of wt loss as well as adverse effects of obesity. Consistency with healthy diet and physical activity recommended.   Influenza  vaccination given -     Flu vaccine HIGH DOSE PF  Other orders -     atorvastatin (LIPITOR) 40 MG tablet; Take 1 tablet (40 mg total) by mouth daily.       Nasser Ku G. Martinique, MD  [facility]. [place] office.

## 2018-06-08 ENCOUNTER — Encounter: Payer: Self-pay | Admitting: Family Medicine

## 2018-06-08 MED ORDER — GABAPENTIN 300 MG PO CAPS: 600.0000 mg | ORAL_CAPSULE | Freq: Every day | ORAL | 3 refills | Status: DC

## 2018-06-08 MED ORDER — ATORVASTATIN CALCIUM 40 MG PO TABS: 40.0000 mg | ORAL_TABLET | Freq: Every day | ORAL | 1 refills | Status: DC

## 2018-06-08 MED ORDER — NIFEDIPINE ER OSMOTIC RELEASE 90 MG PO TB24: 90.0000 mg | ORAL_TABLET | Freq: Every day | ORAL | 2 refills | Status: DC

## 2018-06-10 ENCOUNTER — Other Ambulatory Visit: Payer: Self-pay | Admitting: Family Medicine

## 2018-06-10 MED ORDER — ROSUVASTATIN CALCIUM 20 MG PO TABS: 20.0000 mg | ORAL_TABLET | Freq: Every day | ORAL | 1 refills | Status: DC

## 2018-07-02 ENCOUNTER — Telehealth: Payer: Self-pay | Admitting: Family Medicine

## 2018-07-02 NOTE — Telephone Encounter (Unsigned)
Copied from [place] [id number]. Topic: Quick Communication - Rx Refill/Question >> Jul 02, 2018 12:04 PM Judyann Munson wrote: Medication: losartan 100MG  Hydrochlorothiazide 25 MG tablet  Pharmacy stated the medication is on back order and it will need to be filled separately.   Has the patient contacted their pharmacy? Yes   Preferred Pharmacy (with phone number or street name): [place], [place] - [address] [phone] (Phone) [phone] (Fax)    Agent: Please be advised that RX refills may take up to 3 business days. We ask that you follow-up with your pharmacy.

## 2018-07-02 NOTE — Telephone Encounter (Signed)
Okay to send new Rx?

## 2018-07-03 ENCOUNTER — Other Ambulatory Visit: Payer: Self-pay | Admitting: Family Medicine

## 2018-07-03 DIAGNOSIS — I10 Essential (primary) hypertension: Secondary | ICD-10-CM

## 2018-07-03 DIAGNOSIS — R600 Localized edema: Secondary | ICD-10-CM

## 2018-07-03 MED ORDER — HYDROCHLOROTHIAZIDE 25 MG PO TABS: 25.0000 mg | ORAL_TABLET | Freq: Two times a day (BID) | ORAL | 1 refills | Status: DC

## 2018-07-03 MED ORDER — LOSARTAN POTASSIUM 100 MG PO TABS: 100.0000 mg | ORAL_TABLET | Freq: Every day | ORAL | 1 refills | Status: DC

## 2018-07-03 NOTE — Telephone Encounter (Signed)
Prescription for losartan 100 mg and HCTZ 25 mg were sent to her pharmacy to replace Hyzaar. Thanks, BJ

## 2018-07-04 ENCOUNTER — Other Ambulatory Visit: Payer: Self-pay | Admitting: Family Medicine

## 2018-07-24 ENCOUNTER — Ambulatory Visit
Admission: RE | Admit: 2018-07-24 | Discharge: 2018-07-24 | Disposition: A | Discharge status: Home / Self Care | Payer: Medicare Other | Source: Ambulatory Visit | Attending: Family Medicine | Admitting: Family Medicine

## 2018-07-24 DIAGNOSIS — Z1231 Encounter for screening mammogram for malignant neoplasm of breast: Secondary | ICD-10-CM | POA: Diagnosis not present

## 2018-07-24 DIAGNOSIS — Z1239 Encounter for other screening for malignant neoplasm of breast: Secondary | ICD-10-CM

## 2018-09-13 DIAGNOSIS — M1712 Unilateral primary osteoarthritis, left knee: Secondary | ICD-10-CM | POA: Diagnosis not present

## 2018-09-13 DIAGNOSIS — Z96651 Presence of right artificial knee joint: Secondary | ICD-10-CM | POA: Diagnosis not present

## 2018-09-13 DIAGNOSIS — Z471 Aftercare following joint replacement surgery: Secondary | ICD-10-CM | POA: Diagnosis not present

## 2018-09-13 DIAGNOSIS — M25562 Pain in left knee: Secondary | ICD-10-CM

## 2018-09-13 HISTORY — DX: Pain in left knee: M25.562

## 2018-09-24 DIAGNOSIS — R635 Abnormal weight gain: Secondary | ICD-10-CM | POA: Diagnosis not present

## 2018-09-24 DIAGNOSIS — E785 Hyperlipidemia, unspecified: Secondary | ICD-10-CM | POA: Diagnosis not present

## 2018-09-24 DIAGNOSIS — Z6841 Body Mass Index (BMI) 40.0 and over, adult: Secondary | ICD-10-CM | POA: Diagnosis not present

## 2018-09-26 DIAGNOSIS — K219 Gastro-esophageal reflux disease without esophagitis: Secondary | ICD-10-CM | POA: Diagnosis not present

## 2018-09-26 DIAGNOSIS — E785 Hyperlipidemia, unspecified: Secondary | ICD-10-CM | POA: Diagnosis not present

## 2018-09-26 DIAGNOSIS — Z6841 Body Mass Index (BMI) 40.0 and over, adult: Secondary | ICD-10-CM | POA: Diagnosis not present

## 2018-09-26 DIAGNOSIS — E669 Obesity, unspecified: Secondary | ICD-10-CM | POA: Diagnosis not present

## 2018-09-26 DIAGNOSIS — R7301 Impaired fasting glucose: Secondary | ICD-10-CM | POA: Diagnosis not present

## 2018-09-26 DIAGNOSIS — Z9884 Bariatric surgery status: Secondary | ICD-10-CM | POA: Diagnosis not present

## 2018-09-26 DIAGNOSIS — I10 Essential (primary) hypertension: Secondary | ICD-10-CM | POA: Diagnosis not present

## 2018-09-27 DIAGNOSIS — K219 Gastro-esophageal reflux disease without esophagitis: Secondary | ICD-10-CM

## 2018-09-27 DIAGNOSIS — Z9884 Bariatric surgery status: Secondary | ICD-10-CM

## 2018-09-27 DIAGNOSIS — E785 Hyperlipidemia, unspecified: Secondary | ICD-10-CM

## 2018-09-27 DIAGNOSIS — R9431 Abnormal electrocardiogram [ECG] [EKG]: Secondary | ICD-10-CM | POA: Diagnosis not present

## 2018-09-27 HISTORY — DX: Gastro-esophageal reflux disease without esophagitis: K21.9

## 2018-09-27 HISTORY — DX: Hyperlipidemia, unspecified: E78.5

## 2018-09-27 HISTORY — DX: Bariatric surgery status: Z98.84

## 2018-10-07 DIAGNOSIS — K219 Gastro-esophageal reflux disease without esophagitis: Secondary | ICD-10-CM | POA: Diagnosis not present

## 2018-10-07 DIAGNOSIS — I10 Essential (primary) hypertension: Secondary | ICD-10-CM | POA: Diagnosis not present

## 2018-10-07 DIAGNOSIS — Z9884 Bariatric surgery status: Secondary | ICD-10-CM | POA: Diagnosis not present

## 2018-10-07 DIAGNOSIS — Z6841 Body Mass Index (BMI) 40.0 and over, adult: Secondary | ICD-10-CM | POA: Diagnosis not present

## 2018-10-14 DIAGNOSIS — Z6841 Body Mass Index (BMI) 40.0 and over, adult: Secondary | ICD-10-CM | POA: Diagnosis not present

## 2018-10-18 DIAGNOSIS — Z9884 Bariatric surgery status: Secondary | ICD-10-CM | POA: Diagnosis not present

## 2018-10-18 DIAGNOSIS — K224 Dyskinesia of esophagus: Secondary | ICD-10-CM | POA: Diagnosis not present

## 2018-10-18 DIAGNOSIS — I10 Essential (primary) hypertension: Secondary | ICD-10-CM | POA: Diagnosis not present

## 2018-10-21 DIAGNOSIS — Z6841 Body Mass Index (BMI) 40.0 and over, adult: Secondary | ICD-10-CM | POA: Diagnosis not present

## 2018-10-31 DIAGNOSIS — Z6841 Body Mass Index (BMI) 40.0 and over, adult: Secondary | ICD-10-CM | POA: Diagnosis not present

## 2018-10-31 DIAGNOSIS — G4733 Obstructive sleep apnea (adult) (pediatric): Secondary | ICD-10-CM | POA: Diagnosis not present

## 2018-11-07 DIAGNOSIS — Z6841 Body Mass Index (BMI) 40.0 and over, adult: Secondary | ICD-10-CM | POA: Diagnosis not present

## 2018-12-04 ENCOUNTER — Other Ambulatory Visit: Payer: Self-pay

## 2018-12-04 ENCOUNTER — Ambulatory Visit (INDEPENDENT_AMBULATORY_CARE_PROVIDER_SITE_OTHER): Payer: Medicare Other | Admitting: Family Medicine

## 2018-12-04 ENCOUNTER — Telehealth: Payer: Self-pay | Admitting: Family Medicine

## 2018-12-04 ENCOUNTER — Encounter: Payer: Self-pay | Admitting: Family Medicine

## 2018-12-04 VITALS — BP 138/80 | HR 80 | Resp 12 | Ht 62.0 in | Wt 269.0 lb

## 2018-12-04 DIAGNOSIS — E785 Hyperlipidemia, unspecified: Secondary | ICD-10-CM

## 2018-12-04 DIAGNOSIS — G6289 Other specified polyneuropathies: Secondary | ICD-10-CM | POA: Diagnosis not present

## 2018-12-04 DIAGNOSIS — Z6841 Body Mass Index (BMI) 40.0 and over, adult: Secondary | ICD-10-CM

## 2018-12-04 DIAGNOSIS — I10 Essential (primary) hypertension: Secondary | ICD-10-CM | POA: Diagnosis not present

## 2018-12-04 MED ORDER — EZETIMIBE 10 MG PO TABS: 10.0000 mg | ORAL_TABLET | Freq: Every day | ORAL | 1 refills | Status: DC

## 2018-12-04 NOTE — Telephone Encounter (Signed)
Message sent to Dr. Jordan for review and approval. 

## 2018-12-04 NOTE — Progress Notes (Signed)
Virtual Visit via Video Note   I connected with Tina Villanueva on 12/04/18 at 10:15 AM EDT by a video enabled telemedicine application and verified that I am speaking with the correct person using two identifiers.  Location patient: home Location provider:work or home office Persons participating in the virtual visit: patient, provider  I discussed the limitations of evaluation and management by telemedicine and the availability of in person appointments. The patient expressed understanding and agreed to proceed.   HPI: Tina Villanueva is being seen today for chronic disease management. Last OV 06/04/18.  Since her last OV she has followed with nutritionist. She is drinking 2 shakes per day and a meal. She has noted wt loss,last wt 269 Lb. Because gyms are closed due current public health situation,she is not exercise regularly for 3-4 weeks.   HTN on HCTZ 25 mg bid,cozaar 50 mg daily,and Nifedipine XL 90 mg daily. She is checking BP regularly , < 140/90. Denies severe/frequent headache, visual changes, chest pain, dyspnea, palpitation, claudication,or focal weakness.  Comprehensive Metabolic Panel (09/32/6712 8:42 AM EST) Comprehensive Metabolic Panel (45/80/9983 8:42 AM EST)  Component Value Ref Range Performed At Pathologist Signature  Sodium 138 135 - 146 MMOL/L [place] [facility]   Potassium 4.4Comment: NO VISIBLE HEMOLYSIS 3.5 - 5.3 MMOL/L [place] [facility]   Chloride 102 98 - 110 MMOL/L [place] [facility]   CO2 29 23 - 30 MMOL/L [place] [facility]   BUN 17 8 - 24 MG/DL [place] [facility]   Glucose 102 (H) 70 - 99 MG/DL [place] [facility]   Creatinine 0.73 0.50 - 1.50 MG/DL [place] [facility]   Calcium 9.3 8.5 - 10.5 MG/DL [place] [facility]   Total Protein 6.7 6.0 - 8.3 G/DL [place] [facility]   Albumin  4.2 3.5 -  5.0 G/DL [place] [facility]   Total Bilirubin 0.5 0.1 - 1.2 MG/DL [place] [facility]   Alkaline Phosphatase 93 25 - 125 IU/L or U/L [place] [facility]   AST (SGOT) 14 5 - 40 IU/L or U/L [place] [facility]   ALT (SGPT) 16 5 - 50 IU/L or U/L [place] [facility]   Anion Gap 7 4 - 14 MMOL/L [place] [facility]   Est. GFR African American >=90       HLD, she is on non pharmacologic treatment. Discontinued Crestor because it was causing "RLS." LE discomfort resolved after she discontinued med.  LDL Direct 190 (H)Comment: Please consider Familial Hypercholesterolemia in the differential diagnosis for this patient with LDL-C >=190 mg/dL, provided secondary causes of hyperlipidemia (e.g., hypothyroidism, renal disease, etc) have been excluded. <130 mg/dL [place] [facility]   Total Cholesterol 272 (H) 25 - 199 MG/DL [place] [facility]   Triglycerides 69 10 - 150 MG/DL  [facility]   HDL Cholesterol       Peripheral neuropathy: She stopped Gabapentin a couple months ago. She is following with chiropractor,who recommended tens bid. Symptoms have greatly improved.  Knee OA, she takes Meloxicam 15 mg daily as needed. Pain exacerbated by prolonged walking and by getting up after prolonged sitting. + Stiffness. No edema or erythema.    ROS: See pertinent positives and  negatives per HPI.  Past Medical History:  Diagnosis Date  . Arthritis   . Chicken pox   . Heart murmur   . Hyperlipidemia   . Hypertension     Past Surgical History:  Procedure Laterality Date  . BREAST SURGERY    . TONSILLECTOMY AND ADENOIDECTOMY      Family History  Problem Relation Age of Onset  . Arthritis Mother   . Hyperlipidemia Mother   . Hypertension Mother   . Stroke Mother   . Arthritis Father   . Hyperlipidemia Father   .  Hypertension Father   . Stroke Father   . Breast cancer Neg Hx     Social History   Socioeconomic History  . Marital status: Unknown    Spouse name: Not on file  . Number of children: Not on file  . Years of education: Not on file  . Highest education level: Not on file  Occupational History  . Not on file  Social Needs  . Financial resource strain: Not on file  . Food insecurity:    Worry: Not on file    Inability: Not on file  . Transportation needs:    Medical: Not on file    Non-medical: Not on file  Tobacco Use  . Smoking status: Former Smoker    Packs/day: 0.30    Years: 30.00    Pack years: 9.00  . Smokeless tobacco: Never Used  Substance and Sexual Activity  . Alcohol use: Not on file    Comment: social drinker; now not at all   . Drug use: Never  . Sexual activity: Not on file  Lifestyle  . Physical activity:    Days per week: Not on file    Minutes per session: Not on file  . Stress: Not on file  Relationships  . Social connections:    Talks on phone: Not on file    Gets together: Not on file    Attends religious service: Not on file    Active member of club or organization: Not on file    Attends meetings of clubs or organizations: Not on file    Relationship status: Not on file  . Intimate partner violence:    Fear of current or ex partner: Not on file    Emotionally abused: Not on file    Physically abused: Not on file    Forced sexual activity: Not on file  Other Topics Concern  . Not on file  Social History Narrative   Live alone;    Lives in single level home; one level    Moved here due to retirement x 1 year ago   Brand new house   Walk in tub and oversized shower       Current Outpatient Medications:  .  aspirin 325 MG tablet, Take 325 mg by mouth daily., Disp: , Rfl:  .  Cyanocobalamin (VITAMIN B 12 PO), Take by mouth., Disp: , Rfl:  .  ezetimibe (ZETIA) 10 MG tablet, Take 1 tablet (10 mg total) by mouth daily., Disp: 90 tablet,  Rfl: 1 .  gabapentin (NEURONTIN) 300 MG capsule, Take 2 capsules (600 mg total) by mouth at bedtime., Disp: 60 capsule, Rfl: 3 .  hydrochlorothiazide (HYDRODIURIL) 25 MG tablet, Take 1 tablet (25 mg total) by mouth 2 (two) times daily., Disp: 90 tablet, Rfl: 1 .  losartan (COZAAR) 100 MG tablet, Take 1 tablet (100 mg total) by mouth daily., Disp: 90 tablet, Rfl: 1 .  meloxicam (MOBIC) 15 MG tablet, TAKE 1 TABLET BY MOUTH ONCE DAILY AS NEEDED FOR PAIN, Disp: 90 tablet, Rfl: 0 .  NIFEdipine (PROCARDIA XL/NIFEDICAL-XL) 90 MG 24 hr tablet, Take 1 tablet (90 mg total) by mouth daily., Disp: 90 tablet, Rfl: 2  EXAM:  VITALS per patient if applicable:BP 790/24   Pulse 80   Resp 12   Ht 5\' 2"  (1.575 m)   Wt 269 lb (122 kg) Comment: Home wt  BMI 49.20 kg/m   GENERAL: alert, oriented, appears well and in no acute distress  HEENT: atraumatic, conjunctiva clear, no obvious abnormalities on inspection of face.  NECK: normal movements of the head and necck  LUNGS: on inspection no signs of respiratory distress, breathing rate appears normal, no obvious gross SOB, gasping or wheezing  CV: no obvious cyanosis  Tina: moves all visible extremities without noticeable abnormality  PSYCH/NEURO: pleasant and cooperative, no obvious depression or anxiety, speech and thought processing grossly intact  ASSESSMENT AND PLAN:  Discussed the following assessment and plan:  Essential hypertension, benign  Hyperlipidemia, unspecified hyperlipidemia type  Other polyneuropathy  Morbid obesity with BMI of 50.0-59.9, adult (HCC)  Hyperlipidemia Did not tolerate statin med. She is now interested in trying a different statin but agrees with trying Zetia 10 mg daily. Continue low-fat diet. We will plan on checking lipid panel in 5 months.  Essential hypertension, benign BP adequately controlled. No changes in current management. Continue low-salt diet.  Neuropathy, peripheral Problem improved with  nonpharmacologic treatment. Continue following with chiropractor. Good skin care and feet check periodically.  Morbid obesity with BMI of 50.0-59.9, adult ([place]) She has lost some weight since starting dietary changes. Encouraged to continue a healthful diet and to engage in low impact physical activity. Walking in place is a good option while she is not able to go to the gym.       I discussed the assessment and treatment plan with the patient. She was provided an opportunity to ask questions and all were answered. The patient agreed with the plan and demonstrated an understanding of the instructions.    Return in about 5 months (around 05/06/2019) for HTN,HLD,oa.    Davonte Siebenaler Martinique, MD

## 2018-12-04 NOTE — Telephone Encounter (Signed)
Prescription for Zetia 10 mg was sent to her pharmacy during visual visit, 67-month supply as requested. If needed, please resend. Thanks, BJ

## 2018-12-04 NOTE — Assessment & Plan Note (Signed)
Did not tolerate statin med. She is now interested in trying a different statin but agrees with trying Zetia 10 mg daily. Continue low-fat diet. We will plan on checking lipid panel in 5 months.

## 2018-12-04 NOTE — Assessment & Plan Note (Signed)
BP adequately controlled. No changes in current management. Continue low salt diet. 

## 2018-12-04 NOTE — Assessment & Plan Note (Signed)
Problem improved with nonpharmacologic treatment. Continue following with chiropractor. Good skin care and feet check periodically.

## 2018-12-04 NOTE — Assessment & Plan Note (Signed)
She has lost some weight since starting dietary changes. Encouraged to continue a healthful diet and to engage in low impact physical activity. Walking in place is a good option while she is not able to go to the gym.

## 2018-12-04 NOTE — Telephone Encounter (Signed)
Patient doesn't want Zetia, she is asking for vascepa instead.

## 2018-12-04 NOTE — Telephone Encounter (Signed)
Copied from [place] [id number]. Topic: Quick Communication - Rx Refill/Question >> Dec 04, 2018 10:49 AM Scherrie Gerlach wrote: Medication: vascepa  Pt had virtual visit this am, and Dr Martinique was on to put her on ZETIA.  However, pt's sister takes this other med, which is natural, but needs a prescription . [place], [place] - [address] [phone] (Phone) [phone] (Fax)

## 2018-12-05 DIAGNOSIS — Z6841 Body Mass Index (BMI) 40.0 and over, adult: Secondary | ICD-10-CM | POA: Diagnosis not present

## 2018-12-09 NOTE — Telephone Encounter (Signed)
Please explained that I am not recommending Vascepa because it is not appropriate for elevated LDL. Zetia targets LDL. Vascepa is recommended for elevated TG. Her last FLP with normal TG.  Thanks, BJ

## 2018-12-10 NOTE — Telephone Encounter (Signed)
Left detailed message for patient concerning medications. Patient advised to call office with any questions.

## 2018-12-31 ENCOUNTER — Other Ambulatory Visit: Payer: Self-pay | Admitting: Family Medicine

## 2018-12-31 DIAGNOSIS — R600 Localized edema: Secondary | ICD-10-CM

## 2018-12-31 DIAGNOSIS — I10 Essential (primary) hypertension: Secondary | ICD-10-CM

## 2019-01-13 DIAGNOSIS — Z6841 Body Mass Index (BMI) 40.0 and over, adult: Secondary | ICD-10-CM | POA: Diagnosis not present

## 2019-01-13 DIAGNOSIS — Z713 Dietary counseling and surveillance: Secondary | ICD-10-CM | POA: Diagnosis not present

## 2019-02-05 DIAGNOSIS — I10 Essential (primary) hypertension: Secondary | ICD-10-CM | POA: Diagnosis not present

## 2019-02-05 DIAGNOSIS — E785 Hyperlipidemia, unspecified: Secondary | ICD-10-CM | POA: Diagnosis not present

## 2019-02-05 DIAGNOSIS — Z6841 Body Mass Index (BMI) 40.0 and over, adult: Secondary | ICD-10-CM | POA: Diagnosis not present

## 2019-02-05 DIAGNOSIS — R7303 Prediabetes: Secondary | ICD-10-CM | POA: Diagnosis not present

## 2019-02-11 ENCOUNTER — Ambulatory Visit: Payer: Medicare Other | Admitting: Family Medicine

## 2019-02-11 DIAGNOSIS — L255 Unspecified contact dermatitis due to plants, except food: Secondary | ICD-10-CM | POA: Diagnosis not present

## 2019-02-21 DIAGNOSIS — R7303 Prediabetes: Secondary | ICD-10-CM | POA: Diagnosis not present

## 2019-02-21 DIAGNOSIS — E785 Hyperlipidemia, unspecified: Secondary | ICD-10-CM | POA: Diagnosis not present

## 2019-03-10 DIAGNOSIS — Z713 Dietary counseling and surveillance: Secondary | ICD-10-CM | POA: Diagnosis not present

## 2019-03-10 DIAGNOSIS — Z6841 Body Mass Index (BMI) 40.0 and over, adult: Secondary | ICD-10-CM | POA: Diagnosis not present

## 2019-03-24 DIAGNOSIS — Z6841 Body Mass Index (BMI) 40.0 and over, adult: Secondary | ICD-10-CM | POA: Diagnosis not present

## 2019-03-24 DIAGNOSIS — Z7182 Exercise counseling: Secondary | ICD-10-CM | POA: Diagnosis not present

## 2019-03-31 ENCOUNTER — Other Ambulatory Visit: Payer: Self-pay | Admitting: Family Medicine

## 2019-03-31 DIAGNOSIS — R600 Localized edema: Secondary | ICD-10-CM

## 2019-03-31 DIAGNOSIS — I10 Essential (primary) hypertension: Secondary | ICD-10-CM

## 2019-04-14 DIAGNOSIS — Z6841 Body Mass Index (BMI) 40.0 and over, adult: Secondary | ICD-10-CM | POA: Diagnosis not present

## 2019-05-02 DIAGNOSIS — Z6841 Body Mass Index (BMI) 40.0 and over, adult: Secondary | ICD-10-CM | POA: Diagnosis not present

## 2019-05-12 DIAGNOSIS — Z713 Dietary counseling and surveillance: Secondary | ICD-10-CM | POA: Diagnosis not present

## 2019-05-12 DIAGNOSIS — Z6841 Body Mass Index (BMI) 40.0 and over, adult: Secondary | ICD-10-CM | POA: Diagnosis not present

## 2019-05-27 DIAGNOSIS — I10 Essential (primary) hypertension: Secondary | ICD-10-CM | POA: Diagnosis not present

## 2019-05-27 DIAGNOSIS — R7301 Impaired fasting glucose: Secondary | ICD-10-CM | POA: Diagnosis not present

## 2019-05-27 DIAGNOSIS — Z6841 Body Mass Index (BMI) 40.0 and over, adult: Secondary | ICD-10-CM | POA: Diagnosis not present

## 2019-06-09 DIAGNOSIS — Z7182 Exercise counseling: Secondary | ICD-10-CM | POA: Diagnosis not present

## 2019-06-09 DIAGNOSIS — Z6841 Body Mass Index (BMI) 40.0 and over, adult: Secondary | ICD-10-CM | POA: Diagnosis not present

## 2019-06-24 DIAGNOSIS — F54 Psychological and behavioral factors associated with disorders or diseases classified elsewhere: Secondary | ICD-10-CM | POA: Diagnosis not present

## 2019-06-24 DIAGNOSIS — Z6841 Body Mass Index (BMI) 40.0 and over, adult: Secondary | ICD-10-CM | POA: Diagnosis not present

## 2019-07-05 ENCOUNTER — Telehealth: Payer: Self-pay | Admitting: Family Medicine

## 2019-07-05 DIAGNOSIS — I10 Essential (primary) hypertension: Secondary | ICD-10-CM

## 2019-07-05 DIAGNOSIS — R600 Localized edema: Secondary | ICD-10-CM

## 2019-07-07 NOTE — Telephone Encounter (Signed)
Okay to refill for Meloxicam

## 2019-07-08 NOTE — Telephone Encounter (Signed)
Pt would like to know why her other medications were denied refills.  Please call pt:    Pt: [phone]

## 2019-07-08 NOTE — Telephone Encounter (Signed)
Spoke with patient. Informed her that the medications were denied because she was supposed to f/u in 04/2019. Patient verbalized she was not aware of that. Appointment made for patient for 11/11 at 1100.

## 2019-07-09 ENCOUNTER — Encounter: Payer: Self-pay | Admitting: Family Medicine

## 2019-07-09 ENCOUNTER — Ambulatory Visit: Payer: Medicare Other | Admitting: Family Medicine

## 2019-07-09 ENCOUNTER — Other Ambulatory Visit: Payer: Self-pay

## 2019-07-09 VITALS — BP 156/80 | HR 84 | Temp 97.7°F | Resp 16 | Ht 62.0 in | Wt 258.0 lb

## 2019-07-09 DIAGNOSIS — E785 Hyperlipidemia, unspecified: Secondary | ICD-10-CM | POA: Diagnosis not present

## 2019-07-09 DIAGNOSIS — I10 Essential (primary) hypertension: Secondary | ICD-10-CM | POA: Diagnosis not present

## 2019-07-09 DIAGNOSIS — R600 Localized edema: Secondary | ICD-10-CM

## 2019-07-09 DIAGNOSIS — Z6841 Body Mass Index (BMI) 40.0 and over, adult: Secondary | ICD-10-CM | POA: Diagnosis not present

## 2019-07-09 DIAGNOSIS — M170 Bilateral primary osteoarthritis of knee: Secondary | ICD-10-CM | POA: Diagnosis not present

## 2019-07-09 LAB — BASIC METABOLIC PANEL
BUN: 15 mg/dL (ref 6–23)
CO2: 31 mEq/L (ref 19–32)
Calcium: 9.3 mg/dL (ref 8.4–10.5)
Chloride: 101 mEq/L (ref 96–112)
Creatinine, Ser: 0.7 mg/dL (ref 0.40–1.20)
GFR: 100.5 mL/min (ref >60.00)
Glucose, Bld: 98 mg/dL (ref 70–99)
Potassium: 4.3 mEq/L (ref 3.5–5.1)
Sodium: 139 mEq/L (ref 135–145)

## 2019-07-09 MED ORDER — LOSARTAN POTASSIUM-HCTZ 100-25 MG PO TABS: 1.0000 | ORAL_TABLET | Freq: Every day | ORAL | 2 refills | Status: DC

## 2019-07-09 MED ORDER — HYDROCHLOROTHIAZIDE 25 MG PO TABS: 25.0000 mg | ORAL_TABLET | Freq: Every day | ORAL | 2 refills | Status: DC

## 2019-07-09 MED ORDER — NIFEDIPINE ER OSMOTIC RELEASE 90 MG PO TB24: 90.0000 mg | ORAL_TABLET | Freq: Every day | ORAL | 2 refills | Status: DC

## 2019-07-09 MED ORDER — MELOXICAM 15 MG PO TABS: ORAL_TABLET | ORAL | 0 refills | Status: DC

## 2019-07-09 MED ORDER — EZETIMIBE 10 MG PO TABS: ORAL_TABLET | ORAL | 1 refills | Status: DC

## 2019-07-09 NOTE — Assessment & Plan Note (Signed)
Pain has also improved, avoiding trigger factors + weight loss. We discussed some side effects of chronic NSAIDs. Continue meloxicam 15 mg daily as needed.

## 2019-07-09 NOTE — Progress Notes (Signed)
HPI:   Tina Villanueva is a 68 y.o. female, who is here today for chronic disease management.  She was last seen on 12/04/2018. No new problems since her last visit.  Hypertension:  Denies severe/frequent headache, visual changes, chest pain, dyspnea, palpitation, claudication, focal weakness, or edema.  Lab Results  Component Value Date   CREATININE 0.82 06/04/2018   BUN 15 06/04/2018   NA 140 06/04/2018   K 4.1 06/04/2018   CL 99 06/04/2018   CO2 32 06/04/2018   Currently on losartan 100 mg and HCTZ 25 mg. She had not been taking Procardia XL 90 mg for a few weeks. Home BP readings "fluctuate." No side effects reported.  Hyperlipidemia: Currently she is on nonpharmacologic treatment. She is following low-fat diet.  02/21/2019:  LDL Direct 107 <130 mg/dL  Total Cholesterol 190 25 - 199 MG/DL  Triglycerides 106 10 - 150 MG/DL  HDL Cholesterol 63 35 - 135 MG/DL  Total Chol / HDL Cholesterol 3.0 <4.5   Non-HDL Cholesterol 127    Knee osteoarthritis: Problem seems to be better after weight loss, also that she has not been standing for long, not going shopping. She is taking meloxicam 15 mg daily as needed, she does not take it very frequent. Problem exacerbated by prolonged walking/standing. She is not longer using a cane.  She is participating in a program at [facility] (weight management and behavioral health), she goes every other week.  She has nutritional education and exercise sections. She is also following a healthful diet.  Lab Results  Component Value Date   HGBA1C 5.8 06/04/2018   On 02/21/2019 A1c was 5.4.  Review of Systems  Constitutional: Negative for activity change, appetite change, fatigue and fever.  HENT: Negative for mouth sores, nosebleeds and sore throat.   Eyes: Negative for pain and redness.  Respiratory: Negative for cough and wheezing.   Gastrointestinal: Negative for abdominal pain, nausea and vomiting.        Negative for changes in bowel habits.  Genitourinary: Negative for decreased urine volume, dysuria and hematuria.  Musculoskeletal: Positive for arthralgias. Negative for myalgias.  Neurological: Negative for syncope, facial asymmetry and weakness.  Rest of ROS, see pertinent positives sand negatives in HPI.  Current Outpatient Medications on File Prior to Visit  Medication Sig Dispense Refill  . Cyanocobalamin (VITAMIN B 12 PO) Take by mouth.    Marland Kitchen aspirin 325 MG tablet Take 325 mg by mouth daily.     No current facility-administered medications on file prior to visit.      Past Medical History:  Diagnosis Date  . Arthritis   . Chicken pox   . Heart murmur   . Hyperlipidemia   . Hypertension    No Known Allergies  Social History   Socioeconomic History  . Marital status: Unknown    Spouse name: Not on file  . Number of children: Not on file  . Years of education: Not on file  . Highest education level: Not on file  Occupational History  . Not on file  Social Needs  . Financial resource strain: Not on file  . Food insecurity    Worry: Not on file    Inability: Not on file  . Transportation needs    Medical: Not on file    Non-medical: Not on file  Tobacco Use  . Smoking status: Former Smoker    Packs/day: 0.30    Years: 30.00  Pack years: 9.00  . Smokeless tobacco: Never Used  Substance and Sexual Activity  . Alcohol use: Not on file    Comment: social drinker; now not at all   . Drug use: Never  . Sexual activity: Not on file  Lifestyle  . Physical activity    Days per week: Not on file    Minutes per session: Not on file  . Stress: Not on file  Relationships  . Social Herbalist on phone: Not on file    Gets together: Not on file    Attends religious service: Not on file    Active member of club or organization: Not on file    Attends meetings of clubs or organizations: Not on file    Relationship status: Not on file  Other Topics  Concern  . Not on file  Social History Narrative   Live alone;    Lives in single level home; one level    Moved here due to retirement x 1 year ago   Brand new house   Walk in tub and oversized shower     Vitals:   07/09/19 1055  BP: (!) 156/80  Pulse: 84  Resp: 16  Temp: 97.7 F (36.5 C)   Body mass index is 47.19 kg/m.  Wt Readings from Last 3 Encounters:  07/09/19 258 lb (117 kg)  12/04/18 269 lb (122 kg)  06/04/18 (!) 302 lb 2 oz (137 kg)    Physical Exam  Nursing note and vitals reviewed. Constitutional: She is oriented to person, place, and time. She appears well-developed. No distress.  HENT:  Head: Normocephalic and atraumatic.  Mouth/Throat: Oropharynx is clear and moist and mucous membranes are normal.  Eyes: Pupils are equal, round, and reactive to light. Conjunctivae are normal.  Cardiovascular: Normal rate and regular rhythm.  No murmur heard. Pulses:      Dorsalis pedis pulses are 2+ on the right side and 2+ on the left side.  Respiratory: Effort normal and breath sounds normal. No respiratory distress.  GI: Soft. There is no abdominal tenderness.  Musculoskeletal:        General: Edema (+1 pitting LE edema,bilateral, pedal edema L>R) present.  Lymphadenopathy:    She has no cervical adenopathy.  Neurological: She is alert and oriented to person, place, and time. She has normal strength. No cranial nerve deficit.  Otherwise stable gait,not assisted.  Skin: Skin is warm. No rash noted. No erythema.  Psychiatric: She has a normal mood and affect.  Well groomed, good eye contact.   ASSESSMENT AND PLAN:  Tina Villanueva was seen today for chronic disease management.  Orders Placed This Encounter  Procedures  . Basic Metabolic Panel   Lab Results  Component Value Date   CREATININE 0.70 07/09/2019   BUN 15 07/09/2019   NA 139 07/09/2019   K 4.3 07/09/2019   CL 101 07/09/2019   CO2 31 07/09/2019    Bilateral lower extremity edema Problem  is better controlled after weight loss. Continue HCTZ 25 mg twice daily, lower extremity elevation, and/or compression stockings.  Essential hypertension, benign Elevated BP today. We discussed possible complications of elevated BP. Continue losartan 100 mg daily and HCTZ 25 mg twice daily. Resume Procardia XL 90 mg daily. Monitor BP regularly, if numbers < 110/70 we can consider decreasing dose of Procardia. Continue low-salt diet. Instructed about warning signs.  Bilateral primary osteoarthritis of knee Pain has also improved, avoiding trigger factors + weight loss.  We discussed some side effects of chronic NSAIDs. Continue meloxicam 15 mg daily as needed.  Morbid obesity with BMI of 50.0-59.9, adult ([place]) She has lost about 9 Lb sine 11/2018. We discussed benefits of wt loss as well as adverse effects of obesity. Consistency with healthy diet and physical activity recommended.   Hyperlipidemia Greatly improved with nonpharmacologic treatment. We will repeat lipid panel in 01/2020. Continue low-fat diet and regular physical activity.    Return in about 6 months (around 01/06/2020) for f/u and Medicare.  -Ms. Jory Emmett was advised to return sooner than planned today if new concerns arise.  Betty G. Martinique, MD  [facility]. [place] office.

## 2019-07-09 NOTE — Patient Instructions (Addendum)
A few things to remember from today's visit:   Hyperlipidemia, unspecified hyperlipidemia type  Essential hypertension, benign - Plan: hydrochlorothiazide (HYDRODIURIL) 25 MG tablet, NIFEdipine (PROCARDIA XL/NIFEDICAL-XL) 90 MG 24 hr tablet, Basic Metabolic Panel  Bilateral lower extremity edema - Plan: hydrochlorothiazide (HYDRODIURIL) 25 MG tablet, Basic Metabolic Panel  Bilateral primary osteoarthritis of knee  Zetia changes to 3 times per week. Continue monitoring blood pressure.  Please be sure medication list is accurate. If a new problem present, please set up appointment sooner than planned today.

## 2019-07-09 NOTE — Assessment & Plan Note (Signed)
Problem is better controlled after weight loss. Continue HCTZ 25 mg twice daily, lower extremity elevation, and/or compression stockings.

## 2019-07-09 NOTE — Assessment & Plan Note (Signed)
Elevated BP today. We discussed possible complications of elevated BP. Continue losartan 100 mg daily and HCTZ 25 mg twice daily. Resume Procardia XL 90 mg daily. Monitor BP regularly, if numbers < 110/70 we can consider decreasing dose of Procardia. Continue low-salt diet. Instructed about warning signs.

## 2019-07-09 NOTE — Assessment & Plan Note (Signed)
Greatly improved with nonpharmacologic treatment. We will repeat lipid panel in 01/2020. Continue low-fat diet and regular physical activity.

## 2019-07-09 NOTE — Assessment & Plan Note (Signed)
She has lost about 9 Lb sine 11/2018. We discussed benefits of wt loss as well as adverse effects of obesity. Consistency with healthy diet and physical activity recommended.

## 2019-07-21 DIAGNOSIS — Z6841 Body Mass Index (BMI) 40.0 and over, adult: Secondary | ICD-10-CM | POA: Diagnosis not present

## 2019-07-21 DIAGNOSIS — Z713 Dietary counseling and surveillance: Secondary | ICD-10-CM | POA: Diagnosis not present

## 2019-10-02 NOTE — Progress Notes (Signed)
Patient is scheduled for a follow up appointment with Tina Villanueva on 01/06/2020 at 10:30 and AWV with the health coordinator on 01/06/2020 at 11:15

## 2020-01-05 ENCOUNTER — Other Ambulatory Visit: Payer: Self-pay

## 2020-01-06 ENCOUNTER — Ambulatory Visit: Payer: Medicare Other

## 2020-01-06 ENCOUNTER — Encounter: Payer: Self-pay | Admitting: Family Medicine

## 2020-01-06 ENCOUNTER — Ambulatory Visit (INDEPENDENT_AMBULATORY_CARE_PROVIDER_SITE_OTHER): Payer: Medicare Other | Admitting: Family Medicine

## 2020-01-06 VITALS — BP 126/78 | HR 60 | Temp 97.6°F | Resp 16 | Ht 62.0 in | Wt 284.2 lb

## 2020-01-06 DIAGNOSIS — M170 Bilateral primary osteoarthritis of knee: Secondary | ICD-10-CM | POA: Diagnosis not present

## 2020-01-06 DIAGNOSIS — I10 Essential (primary) hypertension: Secondary | ICD-10-CM | POA: Diagnosis not present

## 2020-01-06 DIAGNOSIS — Z6841 Body Mass Index (BMI) 40.0 and over, adult: Secondary | ICD-10-CM

## 2020-01-06 DIAGNOSIS — L237 Allergic contact dermatitis due to plants, except food: Secondary | ICD-10-CM

## 2020-01-06 DIAGNOSIS — E785 Hyperlipidemia, unspecified: Secondary | ICD-10-CM | POA: Diagnosis not present

## 2020-01-06 DIAGNOSIS — R7301 Impaired fasting glucose: Secondary | ICD-10-CM | POA: Diagnosis not present

## 2020-01-06 DIAGNOSIS — Z0000 Encounter for general adult medical examination without abnormal findings: Secondary | ICD-10-CM | POA: Diagnosis not present

## 2020-01-06 DIAGNOSIS — R600 Localized edema: Secondary | ICD-10-CM | POA: Diagnosis not present

## 2020-01-06 LAB — COMPREHENSIVE METABOLIC PANEL
ALT: 18 U/L (ref 0–35)
AST: 14 U/L (ref 0–37)
Albumin: 4.4 g/dL (ref 3.5–5.2)
Alkaline Phosphatase: 115 U/L (ref 39–117)
BUN: 18 mg/dL (ref 6–23)
CO2: 32 mEq/L (ref 19–32)
Calcium: 9.7 mg/dL (ref 8.4–10.5)
Chloride: 100 mEq/L (ref 96–112)
Creatinine, Ser: 0.71 mg/dL (ref 0.40–1.20)
GFR: 98.72 mL/min (ref >60.00)
Glucose, Bld: 110 mg/dL — ABNORMAL HIGH (ref 70–99)
Potassium: 4.4 mEq/L (ref 3.5–5.1)
Sodium: 139 mEq/L (ref 135–145)
Total Bilirubin: 0.7 mg/dL (ref 0.2–1.2)
Total Protein: 7.2 g/dL (ref 6.0–8.3)

## 2020-01-06 LAB — LIPID PANEL
Cholesterol: 274 mg/dL — ABNORMAL HIGH (ref 0–200)
HDL: 68.5 mg/dL (ref >39.00)
LDL Cholesterol: 173 mg/dL — ABNORMAL HIGH (ref 0–99)
NonHDL: 205.48
Total CHOL/HDL Ratio: 4
Triglycerides: 161 mg/dL — ABNORMAL HIGH (ref 0.0–149.0)
VLDL: 32.2 mg/dL (ref 0.0–40.0)

## 2020-01-06 LAB — HEMOGLOBIN A1C: Hgb A1c MFr Bld: 5.5 % (ref 4.6–6.5)

## 2020-01-06 MED ORDER — FUROSEMIDE 20 MG PO TABS: 20.0000 mg | ORAL_TABLET | Freq: Every day | ORAL | 3 refills | Status: DC | PRN

## 2020-01-06 MED ORDER — NIFEDIPINE ER OSMOTIC RELEASE 90 MG PO TB24: 90.0000 mg | ORAL_TABLET | Freq: Every day | ORAL | 2 refills | Status: DC

## 2020-01-06 MED ORDER — LOSARTAN POTASSIUM-HCTZ 100-25 MG PO TABS: 1.0000 | ORAL_TABLET | Freq: Every day | ORAL | 2 refills | Status: DC

## 2020-01-06 MED ORDER — MELOXICAM 15 MG PO TABS: ORAL_TABLET | ORAL | 0 refills | Status: DC

## 2020-01-06 MED ORDER — METHYLPREDNISOLONE ACETATE 40 MG/ML IJ SUSP
40.0000 mg | Freq: Once | INTRAMUSCULAR | Status: AC
  Administered 2020-01-06: 40 mg via INTRAMUSCULAR

## 2020-01-06 MED ORDER — EZETIMIBE 10 MG PO TABS: ORAL_TABLET | ORAL | 1 refills | Status: DC

## 2020-01-06 NOTE — Assessment & Plan Note (Signed)
BP adequately controlled. No changes in current management. Continue monitoring BP regularly. Continue low-salt diet. She is overdue for eye exam.

## 2020-01-06 NOTE — Assessment & Plan Note (Signed)
We discussed benefits of wt loss as well as adverse effects of obesity. Consistency with healthy diet and physical activity recommended.  

## 2020-01-06 NOTE — Assessment & Plan Note (Signed)
Problem is is stable. We had discussed possible side effects of chronic NSAID use. Continue meloxicam 15 mg daily as needed. Weight loss will also help. She is planning on starting aquatic exercises.

## 2020-01-06 NOTE — Assessment & Plan Note (Signed)
Stable. Furosemide 20 mg daily as needed started today. LE elevation,appropriate skin care ,and compression stockings also recommended.

## 2020-01-06 NOTE — Patient Instructions (Signed)
  Ms. Mohon , Thank you for taking time to come for your Medicare Wellness Visit. I appreciate your ongoing commitment to your health goals. Please review the following plan we discussed and let me know if I can assist you in the future.   These are the goals we discussed: Goals    . patient     Get back [place] and start back in [place] chair yoga        This is a list of the screening recommended for you and due dates:  Health Maintenance  Topic Date Due  . COVID-19 Vaccine (1) 01/22/2020*  . Tetanus Vaccine  06/10/2020*  . Pneumonia vaccines (2 of 2 - PPSV23) 01/05/2021*  . Colon Cancer Screening  05/28/2026*  . Flu Shot  03/28/2020  . Mammogram  07/24/2020  . DEXA scan (bone density measurement)  Completed  .  Hepatitis C: One time screening is recommended by Center for Disease Control  (CDC) for  adults born from [birth year] through 1965.   Completed  *Topic was postponed. The date shown is not the original due date.   A few tips:  -As we age balance is not as good as it was, so there is a higher risks for falls. Please remove small rugs and furniture that is "in your way" and could increase the risk of falls. Stretching exercises may help with fall prevention: Yoga and Tai Chi are some examples. Low impact exercise is better, so you are not very achy the next day.  -Sun screen and avoidance of direct sun light recommended. Caution with dehydration, if working outdoors be sure to drink enough fluids.  - Some medications are not safe as we age, increases the risk of side effects and can potentially interact with other medication you are also taken;  including some of over the counter medications. Be sure to let me know when you start a new medication even if it is a dietary/vitamin supplement.   -Healthy diet low in red meet/animal fat and sugar + regular physical activity is recommended.   Today we added furosemide 20 mg to take daily as needed for  swelling.Continue wearing compression stockings. No changes in rest of your meds. Start pool exercises.

## 2020-01-06 NOTE — Assessment & Plan Note (Signed)
She has not tolerated statins in the past. Continue Zetia 10 mg daily and low-fat diet. Further recommendation will be given according to lipid panel results.

## 2020-01-06 NOTE — Assessment & Plan Note (Signed)
Healthy lifestyle for primary prevention recommended. Further recommendation will be given according to A1c results.

## 2020-01-06 NOTE — Progress Notes (Signed)
Chief Complaint  Patient presents with  . Medicare Wellness  . Follow-up    HPI:  Tina Villanueva is a 69 y.o. female, who is here today for chronic disease management and AWV. She was last seen on 07/09/2019, virtual visit. No new problems since her last visit.  She lives alone.  Her daughter lives 15 minutes from her house. Independent ADL's and IADL's.  Functional Status Survey: Is the patient deaf or have difficulty hearing?: No Does the patient have difficulty seeing, even when wearing glasses/contacts?: No Does the patient have difficulty concentrating, remembering, or making decisions?: No Does the patient have difficulty walking or climbing stairs?: No Does the patient have difficulty dressing or bathing?: No Does the patient have difficulty doing errands alone such as visiting a doctor's office or shopping?: No  Fall Risk  01/06/2020 06/04/2018 04/01/2018 12/05/2016  Falls in the past year? 0 Yes No Yes  Comment - - Emmi Telephone Survey: data to providers prior to load -  Number falls in past yr: 0 1 - 2 or more  Injury with Fall? 0 No - -  Risk for fall due to : Orthopedic patient;Impaired mobility - - -  Follow up Education provided Falls prevention discussed - Education provided  Comment - - - both were accidents    Providers she sees regularly:N/A Eye care provider: She does not have one. Planning on arranging appt.  CV risk factors: Age,obesity,HLD,HTN,and FHx.  Depression screen [facility] 2/9 01/06/2020  Decreased Interest 0  Down, Depressed, Hopeless 0  PHQ - 2 Score 0   Mini-Cog - 01/06/20 1100    Normal clock drawing test?  yes    How many words correct?  3       Hearing Screening   _0  _1  _2  _3  _4  _5  _6  _7  _8   Right ear:           Left ear:             Visual Acuity Screening   Right eye Left eye Both eyes  Without correction: _9  With correction:      She is not interested in having further  screening or vaccination. She has not had COVID 19 vaccine and does not want it.  Hyperlipidemia: Currently she is on Zetia 10 mg daily. She has not been consistent with following a healthful diet. She is not exercising regularly but she is planning on resuming aquatic aerobic exercises.  Lab Results  Component Value Date   CHOL 197 12/03/2017   HDL 65.90 12/03/2017   LDLCALC 110 (H) 12/03/2017   TRIG 105.0 12/03/2017   CHOLHDL 3 12/03/2017   Lab Results  Component Value Date   ALT 13 09/05/2016   AST 11 09/05/2016   ALKPHOS 90 09/05/2016   BILITOT 0.6 09/05/2016   Hypertension:She is on Losartan-HCTZ 100-25 mg daily and Procardia XL 90 mg daily. Home BP's 120's/70's.  Negative for severe/frequent headache, visual changes, chest pain, dyspnea, palpitation, claudication,or focal weakness.  Lab Results  Component Value Date   CREATININE 0.70 07/09/2019   BUN 15 07/09/2019   NA 139 07/09/2019   K 4.3 07/09/2019   CL 101 07/09/2019   CO2 31 07/09/2019   Lower extremity edema/lymphedema: She is not longer on a second dose of HCTZ 25 mg, apparently her health insurance did not cover it. Problem is worse at the end of the day and alleviated by LE elevation. She wears compression stockings. Negative for pain,skin ulcers,  or erythema.  Generalized OA, mainly knee pain. S/P right TKR. Currently she is on meloxicam 50 mg daily as needed.  Prediabetes: Negative for polydipsia,polyuria, or polyphagia.  Lab Results  Component Value Date   HGBA1C 5.8 06/04/2018   Today she is complaining about 2 days of pruritic rash on hands and wrist. She did some yard work 3 to 4 days ago, she was exposed to poison ivy.  In the past she has had similar reaction and symptoms resolved after a steroid shot.  She would like to have one today.  Review of Systems  Constitutional: Negative for activity change, appetite change, fatigue and fever.  HENT: Negative for mouth sores, nosebleeds and  sore throat.   Respiratory: Negative for cough and wheezing.   Gastrointestinal: Negative for abdominal pain, nausea and vomiting.       Negative for changes in bowel habits.  Genitourinary: Negative for decreased urine volume and hematuria.  Musculoskeletal: Positive for arthralgias. Negative for gait problem.  Skin: Negative for wound.  Neurological: Negative for syncope and facial asymmetry.  Psychiatric/Behavioral: Negative for confusion.  Rest of ROS, see pertinent positives sand negatives in HPI  Current Outpatient Medications on File Prior to Visit  Medication Sig Dispense Refill  . Cyanocobalamin (VITAMIN B 12 PO) Take by mouth.     No current facility-administered medications on file prior to visit.   Past Medical History:  Diagnosis Date  . Arthritis   . Chicken pox   . Heart murmur   . Hyperlipidemia   . Hypertension    No Known Allergies  Social History   Socioeconomic History  . Marital status: Unknown    Spouse name: Not on file  . Number of children: Not on file  . Years of education: Not on file  . Highest education level: Not on file  Occupational History  . Not on file  Tobacco Use  . Smoking status: Former Smoker    Packs/day: 0.30    Years: 30.00    Pack years: 9.00  . Smokeless tobacco: Never Used  Substance and Sexual Activity  . Alcohol use: Not on file    Comment: social drinker; now not at all   . Drug use: Never  . Sexual activity: Not on file  Other Topics Concern  . Not on file  Social History Narrative   Live alone;    Lives in single level home; one level    Moved here due to retirement x 1 year ago   Brand new house   Walk in tub and oversized shower    Social Determinants of Health   Financial Resource Strain:   . Difficulty of Paying Living Expenses:   Food Insecurity:   . Worried About Charity fundraiser in the Last Year:   . Arboriculturist in the Last Year:   Transportation Needs:   . Film/video editor  (Medical):   Marland Kitchen Lack of Transportation (Non-Medical):   Physical Activity:   . Days of Exercise per Week:   . Minutes of Exercise per Session:   Stress:   . Feeling of Stress :   Social Connections:   . Frequency of Communication with Friends and Family:   . Frequency of Social Gatherings with Friends and Family:   . Attends Religious Services:   . Active Member of Clubs or Organizations:   . Attends Archivist Meetings:   Marland Kitchen Marital Status:     Vitals:   01/06/20 1034  BP: 126/78  Pulse: 60  Resp: 16  Temp: 97.6 F (36.4 C)  SpO2: 99%   Body mass index is 51.99 kg/m.   Physical Exam  Nursing note and vitals reviewed. Constitutional: She is oriented to person, place, and time. She appears well-developed. No distress.  HENT:  Head: Normocephalic and atraumatic.  Mouth/Throat: Oropharynx is clear and moist and mucous membranes are normal.  Eyes: Pupils are equal, round, and reactive to light. Conjunctivae are normal.  Cardiovascular: Normal rate and regular rhythm.  Murmur (SEM I/VI LUSB and RUSB) heard. Pulses:      Dorsalis pedis pulses are 2+ on the right side and 2+ on the left side.  Respiratory: Effort normal and breath sounds normal. No respiratory distress.  GI: Soft. She exhibits no mass. There is no hepatomegaly. There is no abdominal tenderness.  Musculoskeletal:        General: Edema (LLE lymphedema, 1+ pitting edema 3+ pedal edema.RLE 2+ pitting edema.) present.  Lymphadenopathy:    She has no cervical adenopathy.  Neurological: She is alert and oriented to person, place, and time. She has normal strength. No cranial nerve deficit. Gait normal.  Skin: Skin is warm. Rash noted. Rash is papular. No erythema.     Confluent rash on wrists.  Psychiatric: She has a normal mood and affect.  Well groomed, good eye contact.   ASSESSMENT AND PLAN:  Tina Villanueva was seen today for chronic disease management and AWV.  Orders Placed This Encounter   Procedures  . Comprehensive metabolic panel  . Lipid panel  . Hemoglobin A1c    Lab Results  Component Value Date   CREATININE 0.71 01/06/2020   BUN 18 01/06/2020   NA 139 01/06/2020   K 4.4 01/06/2020   CL 100 01/06/2020   CO2 32 01/06/2020   Lab Results  Component Value Date   ALT 18 01/06/2020   AST 14 01/06/2020   ALKPHOS 115 01/06/2020   BILITOT 0.7 01/06/2020   Lab Results  Component Value Date   CHOL 274 (H) 01/06/2020   HDL 68.50 01/06/2020   LDLCALC 173 (H) 01/06/2020   TRIG 161.0 (H) 01/06/2020   CHOLHDL 4 01/06/2020   Lab Results  Component Value Date   HGBA1C 5.5 01/06/2020    Medicare annual wellness visit, subsequent We discussed the importance of staying active, physically and mentally, as well as the benefits of a healthy/balance diet. Low impact exercise that involve stretching and strengthing are ideal. Vaccines: She does not get vaccines. We discussed preventive screening for the next 5-10 years, summery of recommendations given in AVS. Fall prevention.  Advance directives and end of life discussed, her daughter knows what to do. Advance directive package given, recommend completing forms.  The 10-year ASCVD risk score Mikey Bussing DC Brooke Bonito., et al., 2013) is: 14.6%   Values used to calculate the score:     Age: 2 years     Sex: Female     Is Non-Hispanic African American: Yes     Diabetic: No     Tobacco smoker: No     Systolic Blood Pressure: 409 mmHg     Is BP treated: Yes     HDL Cholesterol: 68.5 mg/dL     Total Cholesterol: 274 mg/dL  Essential hypertension, benign BP adequately controlled. No changes in current management. Continue monitoring BP regularly. Continue low-salt diet. She is overdue for eye exam.  Bilateral lower extremity edema Stable. Furosemide 20 mg daily as needed started today. LE  elevation,appropriate skin care ,and compression stockings also recommended.  Bilateral primary osteoarthritis of knee Problem is is  stable. We had discussed possible side effects of chronic NSAID use. Continue meloxicam 15 mg daily as needed. Weight loss will also help. She is planning on starting aquatic exercises.  Hyperlipidemia She has not tolerated statins in the past. Continue Zetia 10 mg daily and low-fat diet. Further recommendation will be given according to lipid panel results.   IFG (impaired fasting glucose) Healthy lifestyle for primary prevention recommended. Further recommendation will be given according to A1c results.  Morbid obesity with BMI of 50.0-59.9, adult ([place]) We discussed benefits of wt loss as well as adverse effects of obesity. Consistency with healthy diet and physical activity recommended.   Contact dermatitis due to poison ivy Here in the office and after verbal consent she received Depo Medrol 40 mg IM x 1. Monitor for new symptoms.   Return in about 6 months (around 07/08/2020) for HTN,.   Danyah Guastella G. Martin[place], MD  [facility]. [place] office.  A few tips:  -As we age balance is not as good as it was, so there is a higher risks for falls. Please remove small rugs and furniture that is "in your way" and could increase the risk of falls. Stretching exercises may help with fall prevention: Yoga and Tai Chi are some examples. Low impact exercise is better, so you are not very achy the next day.  -Sun screen and avoidance of direct sun light recommended. Caution with dehydration, if working outdoors be sure to drink enough fluids.  - Some medications are not safe as we age, increases the risk of side effects and can potentially interact with other medication you are also taken;  including some of over the counter medications. Be sure to let me know when you start a new medication even if it is a dietary/vitamin supplement.   -Healthy diet low in red meet/animal fat and sugar + regular physical activity is recommended.   Today we added furosemide 20 mg to take daily  as needed for swelling.Continue wearing compression stockings. No changes in rest of your meds. Start pool exercises.

## 2020-01-16 ENCOUNTER — Other Ambulatory Visit: Payer: Self-pay | Admitting: *Deleted

## 2020-01-16 MED ORDER — EZETIMIBE 10 MG PO TABS: ORAL_TABLET | ORAL | 1 refills | Status: DC

## 2021-01-18 ENCOUNTER — Encounter: Payer: Self-pay | Admitting: Family Medicine

## 2021-01-18 ENCOUNTER — Other Ambulatory Visit: Payer: Self-pay

## 2021-01-18 ENCOUNTER — Ambulatory Visit (INDEPENDENT_AMBULATORY_CARE_PROVIDER_SITE_OTHER): Payer: Medicare Other | Admitting: Family Medicine

## 2021-01-18 VITALS — BP 128/80 | HR 48 | Temp 97.6°F | Resp 16 | Ht 62.0 in | Wt 295.1 lb

## 2021-01-18 DIAGNOSIS — I499 Cardiac arrhythmia, unspecified: Secondary | ICD-10-CM | POA: Diagnosis not present

## 2021-01-18 DIAGNOSIS — R600 Localized edema: Secondary | ICD-10-CM

## 2021-01-18 DIAGNOSIS — M170 Bilateral primary osteoarthritis of knee: Secondary | ICD-10-CM | POA: Diagnosis not present

## 2021-01-18 DIAGNOSIS — I10 Essential (primary) hypertension: Secondary | ICD-10-CM

## 2021-01-18 DIAGNOSIS — E785 Hyperlipidemia, unspecified: Secondary | ICD-10-CM

## 2021-01-18 DIAGNOSIS — Z0000 Encounter for general adult medical examination without abnormal findings: Secondary | ICD-10-CM | POA: Diagnosis not present

## 2021-01-18 DIAGNOSIS — Z6841 Body Mass Index (BMI) 40.0 and over, adult: Secondary | ICD-10-CM

## 2021-01-18 DIAGNOSIS — R7301 Impaired fasting glucose: Secondary | ICD-10-CM

## 2021-01-18 MED ORDER — LOSARTAN POTASSIUM-HCTZ 100-25 MG PO TABS: 1.0000 | ORAL_TABLET | Freq: Every day | ORAL | 3 refills | Status: DC

## 2021-01-18 MED ORDER — EZETIMIBE 10 MG PO TABS: ORAL_TABLET | ORAL | 3 refills | Status: DC

## 2021-01-18 MED ORDER — NIFEDIPINE ER OSMOTIC RELEASE 90 MG PO TB24: 90.0000 mg | ORAL_TABLET | Freq: Every day | ORAL | 3 refills | Status: DC

## 2021-01-18 MED ORDER — FUROSEMIDE 20 MG PO TABS: 20.0000 mg | ORAL_TABLET | Freq: Every day | ORAL | 3 refills | Status: DC | PRN

## 2021-01-18 MED ORDER — MELOXICAM 15 MG PO TABS: ORAL_TABLET | ORAL | 0 refills | Status: DC

## 2021-01-18 NOTE — Progress Notes (Signed)
HPI: [name] is a 70 y.o. female, who is here today for CPE and follow up.  States that she has a supplemental insurance that covers CPE.  She was last seen on 01/06/20. No new problems sine her last visit. She lives alone, her daughter lives close by. She does not exercise regularly.  Independent ADL's and IADL's.  Immunization History  Administered Date(s) Administered  . Influenza, High Dose Seasonal PF 05/11/2017, 06/04/2018  . Influenza-Unspecified 05/11/2017  . Pneumococcal Conjugate-13 05/02/2016  . Zoster Recombinat (Shingrix) 11/19/2017   Colon cancer screening: Not interested in further screening.  HLD: She is on Zetia 10 mg daily. She is also following low fat diet.  Lab Results  Component Value Date   CHOL 274 (H) 01/06/2020   HDL 68.50 01/06/2020   LDLCALC 173 (H) 01/06/2020   TRIG 161.0 (H) 01/06/2020   CHOLHDL 4 01/06/2020   Lab Results  Component Value Date   ALT 18 01/06/2020   AST 14 01/06/2020   ALKPHOS 115 01/06/2020   BILITOT 0.7 01/06/2020   She is taking OTC medication to prevent blood clots. This was recommended by [facility] when treated with Ivermectin for COVID 19 infection about a year ago. Aspirin causes nose bleeding. OTC supplement is also helping with LE edema. She is also on Furosemide 20 mg daily prn.  She also wears her compression stockings.  Prediabetes: Last HgA1C was 5.5 on 01/06/20. Negative for polydipsia,polyuria, or polyphagia.  HTN: Home BP similar to readings today. She is on Losartan-HCTZ 100-25 mg daily and Procardia XL 90 mg daily.  Component     Latest Ref Rng & Units 01/06/2020  Sodium     135 - 145 mEq/L 139  Potassium     3.5 - 5.1 mEq/L 4.4  Chloride     96 - 112 mEq/L 100  CO2     19 - 32 mEq/L 32  Glucose     70 - 99 mg/dL 110 (H)  BUN     6 - 23 mg/dL 18  Creatinine     0.40 - 1.20 mg/dL 0.71   Today I noted irregular HR, which she has had before but seems to be  more noticeable today. She has not noted SOB or palpitations. Dr Tina Villanueva is her eye care provider,annually. Early cataracts seen during last examination.  Requesting refills for Meloxicam 15 mg, which she takes daily prn for knee OA. Medication has been well tolerated and still helping with pain.  Review of Systems  Constitutional: Negative for activity change, appetite change, fatigue and fever.  HENT: Negative for mouth sores, nosebleeds and sore throat.   Eyes: Negative for photophobia and pain.  Respiratory: Negative for cough and wheezing.   Cardiovascular: Positive for leg swelling. Negative for chest pain.  Gastrointestinal: Negative for abdominal pain, nausea and vomiting.       Negative for changes in bowel habits.  Endocrine: Negative for cold intolerance and heat intolerance.  Genitourinary: Negative for decreased urine volume, dysuria and hematuria.  Musculoskeletal: Positive for arthralgias. Negative for myalgias.  Skin: Negative for rash and wound.  Neurological: Negative for syncope, facial asymmetry and weakness.  Psychiatric/Behavioral: Negative for confusion. The patient is not nervous/anxious.   All other systems reviewed and are negative.  Current Outpatient Medications on File Prior to Visit  Medication Sig Dispense Refill  . Cyanocobalamin (VITAMIN B 12 PO) Take by mouth.     No current facility-administered medications on file prior to visit.  Past Medical History:  Diagnosis Date  . Arthritis   . Chicken pox   . Heart murmur   . Hyperlipidemia   . Hypertension    No Known Allergies  Social History   Socioeconomic History  . Marital status: Single    Spouse name: Not on file  . Number of children: Not on file  . Years of education: Not on file  . Highest education level: Not on file  Occupational History  . Not on file  Tobacco Use  . Smoking status: Former Smoker    Packs/day: 0.30    Years: 30.00    Pack years: 9.00  . Smokeless tobacco:  Never Used  Substance and Sexual Activity  . Alcohol use: Not on file    Comment: social drinker; now not at all   . Drug use: Never  . Sexual activity: Not on file  Other Topics Concern  . Not on file  Social History Narrative   Live alone;    Lives in single level home; one level    Moved here due to retirement x 1 year ago   Brand new house   Walk in tub and oversized shower    Social Determinants of Health   Financial Resource Strain: Not on file  Food Insecurity: Not on file  Transportation Needs: Not on file  Physical Activity: Not on file  Stress: Not on file  Social Connections: Not on file    Vitals:   01/18/21 1047  BP: 128/80  Pulse: (!) 48  Resp: 16  Temp: 97.6 F (36.4 C)  SpO2: 98%   Wt Readings from Last 3 Encounters:  01/18/21 295 lb 2 oz (133.9 kg)  01/06/20 284 lb 4 oz (128.9 kg)  07/09/19 258 lb (117 kg)   Body mass index is 53.98 kg/m.  Physical Exam Vitals and nursing note reviewed.  Constitutional:      General: She is not in acute distress.    Appearance: She is well-developed.  HENT:     Head: Normocephalic and atraumatic.     Mouth/Throat:     Mouth: Mucous membranes are moist.  Eyes:     Conjunctiva/sclera: Conjunctivae normal.     Pupils: Pupils are equal, round, and reactive to light.  Cardiovascular:     Rate and Rhythm: Bradycardia present. Rhythm irregular.     Pulses:          Dorsalis pedis pulses are 2+ on the right side and 2+ on the left side.     Heart sounds: Murmur (SEM I/VI LUSB and RUSB) heard.      Comments: Pedal and LE edema, 2+, bilateral. Pulmonary:     Effort: Pulmonary effort is normal. No respiratory distress.     Breath sounds: Normal breath sounds.  Abdominal:     Palpations: Abdomen is soft.     Tenderness: There is no abdominal tenderness.  Musculoskeletal:     Right lower leg: Edema present.     Left lower leg: Edema present.  Lymphadenopathy:     Cervical: No cervical adenopathy.  Skin:     General: Skin is warm.     Findings: No erythema or rash.  Neurological:     General: No focal deficit present.     Mental Status: She is alert and oriented to person, place, and time.     Cranial Nerves: No cranial nerve deficit.  Psychiatric:     Comments: Well groomed, good eye contact.   ASSESSMENT AND PLAN:  [name] was seen today for CPE and follow-up.  Orders Placed This Encounter  Procedures  . Comprehensive metabolic panel  . Lipid panel  . Hemoglobin A1c  . CBC  . TSH  . Ambulatory referral to Cardiology  . EKG 12-Lead   Lab Results  Component Value Date   CHOL 242 (H) 01/19/2021   HDL 60.50 01/19/2021   LDLCALC 154 (H) 01/19/2021   TRIG 138.0 01/19/2021   CHOLHDL 4 01/19/2021   Lab Results  Component Value Date   HGBA1C 6.0 01/19/2021   Lab Results  Component Value Date   CREATININE 0.72 01/19/2021   BUN 15 01/19/2021   NA 141 01/19/2021   K 3.9 01/19/2021   CL 102 01/19/2021   CO2 31 01/19/2021   Lab Results  Component Value Date   ALT 14 01/19/2021   AST 14 01/19/2021   ALKPHOS 97 01/19/2021   BILITOT 0.8 01/19/2021   Routine general medical examination at a health care facility We discussed the importance of regular low impact physical activity and healthy diet for prevention of chronic illness and/or complications. Preventive guidelines reviewed. Vaccination: She is not interested in vaccination. She will arrange mammogram. Not interested in colon cancer screening.  Ca++ and vit D supplementation recommended. Next CPE in a year.  Hyperlipidemia, unspecified hyperlipidemia type Continue Zetia 10 mg daily and low fat diet. Further recommendations according to FLP result.  IFG (impaired fasting glucose) Healthy life style for primary prevention recommended.  Essential hypertension, benign BP is adequately controlled. No changes in Procardia XL or Losartan-HCTZ dose. Low salt diet. Eye exam is current.  -     NIFEdipine  (PROCARDIA XL/NIFEDICAL-XL) 90 MG 24 hr tablet; Take 1 tablet (90 mg total) by mouth daily.  Bilateral lower extremity edema Problem is stable. Continue appropriate skin care and compression stocking. No changes in Furosemide dose.  -     furosemide (LASIX) 20 MG tablet; Take 1 tablet (20 mg total) by mouth daily as needed.  Irregular heart rate Asymptomatic. EKG today with new findings.SR,LAD,PVC with bigeminy (new when compared with EKG in 02/2017). Instructed about warning signs. Cardio referral palced..  Morbid obesity with BMI of 50.0-59.9, adult ([place]) Gained about 11 Lb since her last visit in 12/2019. She understands the benefits of wt loss as well as adverse effects of obesity. Consistency with healthy diet and physical activity recommended.  Bilateral primary osteoarthritis of knee Problem is stable. Meloxican 15 mg daily prn still helping. We have discussed side effects of NSAID's.  -     meloxicam (MOBIC) 15 MG tablet; TAKE 1 TABLET BY MOUTH ONCE DAILY AS NEEDED FOR PAIN   Return in about 6 months (around 07/21/2021) for Needs AWV.  Tina Angello G. [place], MD  [facility]. [place] office.  A few things to remember from today's visit:   Hyperlipidemia, unspecified hyperlipidemia type - Plan: Comprehensive metabolic panel, Lipid panel  IFG (impaired fasting glucose) - Plan: Hemoglobin A1c  Essential hypertension, benign  Bilateral lower extremity edema  Irregular heart rate - Plan: EKG 12-Lead, CBC, TSH  If you need refills please call your pharmacy. Do not use My Chart to request refills or for acute issues that need immediate attention.   No changes in current medications. We do not have lab service today, so I need to bring you back later this week for fasting labs.  Arrange mammogram. Cardio appt will be arraged.  Please be sure medication list is accurate. If a new  problem present, please set up appointment sooner than planned  today.

## 2021-01-18 NOTE — Patient Instructions (Addendum)
  A few things to remember from today's visit:   Hyperlipidemia, unspecified hyperlipidemia type - Plan: Comprehensive metabolic panel, Lipid panel  IFG (impaired fasting glucose) - Plan: Hemoglobin A1c  Essential hypertension, benign  Bilateral lower extremity edema  Irregular heart rate - Plan: EKG 12-Lead, CBC, TSH  If you need refills please call your pharmacy. Do not use My Chart to request refills or for acute issues that need immediate attention.   No changes in current medications. We do not have lab service today, so I need to bring you back later this week for fasting labs.  Arrange mammogram. Cardio appt will be arraged.  Please be sure medication list is accurate. If a new problem present, please set up appointment sooner than planned today.

## 2021-01-19 ENCOUNTER — Other Ambulatory Visit (INDEPENDENT_AMBULATORY_CARE_PROVIDER_SITE_OTHER): Payer: Medicare Other

## 2021-01-19 DIAGNOSIS — R7301 Impaired fasting glucose: Secondary | ICD-10-CM | POA: Diagnosis not present

## 2021-01-19 DIAGNOSIS — I499 Cardiac arrhythmia, unspecified: Secondary | ICD-10-CM | POA: Diagnosis not present

## 2021-01-19 DIAGNOSIS — E785 Hyperlipidemia, unspecified: Secondary | ICD-10-CM

## 2021-01-19 LAB — CBC
HCT: 39.1 % (ref 36.0–46.0)
Hemoglobin: 12.9 g/dL (ref 12.0–15.0)
MCHC: 32.9 g/dL (ref 30.0–36.0)
MCV: 90.6 fl (ref 78.0–100.0)
Platelets: 290 10*3/uL (ref 150.0–400.0)
RBC: 4.32 Mil/uL (ref 3.87–5.11)
RDW: 13.3 % (ref 11.5–15.5)
WBC: 6.5 10*3/uL (ref 4.0–10.5)

## 2021-01-19 LAB — COMPREHENSIVE METABOLIC PANEL
ALT: 14 U/L (ref 0–35)
AST: 14 U/L (ref 0–37)
Albumin: 4.1 g/dL (ref 3.5–5.2)
Alkaline Phosphatase: 97 U/L (ref 39–117)
BUN: 15 mg/dL (ref 6–23)
CO2: 31 mEq/L (ref 19–32)
Calcium: 9.4 mg/dL (ref 8.4–10.5)
Chloride: 102 mEq/L (ref 96–112)
Creatinine, Ser: 0.72 mg/dL (ref 0.40–1.20)
GFR: 84.86 mL/min (ref >60.00)
Glucose, Bld: 113 mg/dL — ABNORMAL HIGH (ref 70–99)
Potassium: 3.9 mEq/L (ref 3.5–5.1)
Sodium: 141 mEq/L (ref 135–145)
Total Bilirubin: 0.8 mg/dL (ref 0.2–1.2)
Total Protein: 6.9 g/dL (ref 6.0–8.3)

## 2021-01-19 LAB — LIPID PANEL
Cholesterol: 242 mg/dL — ABNORMAL HIGH (ref 0–200)
HDL: 60.5 mg/dL (ref >39.00)
LDL Cholesterol: 154 mg/dL — ABNORMAL HIGH (ref 0–99)
NonHDL: 181.42
Total CHOL/HDL Ratio: 4
Triglycerides: 138 mg/dL (ref 0.0–149.0)
VLDL: 27.6 mg/dL (ref 0.0–40.0)

## 2021-01-19 LAB — HEMOGLOBIN A1C: Hgb A1c MFr Bld: 6 % (ref 4.6–6.5)

## 2021-01-19 LAB — TSH: TSH: 2.49 u[IU]/mL (ref 0.35–4.50)

## 2021-01-24 ENCOUNTER — Encounter: Payer: Self-pay | Admitting: Family Medicine

## 2021-02-24 ENCOUNTER — Other Ambulatory Visit: Payer: Self-pay

## 2021-02-24 DIAGNOSIS — I10 Essential (primary) hypertension: Secondary | ICD-10-CM | POA: Insufficient documentation

## 2021-02-24 DIAGNOSIS — M1990 Unspecified osteoarthritis, unspecified site: Secondary | ICD-10-CM | POA: Insufficient documentation

## 2021-02-24 DIAGNOSIS — B019 Varicella without complication: Secondary | ICD-10-CM | POA: Insufficient documentation

## 2021-02-24 DIAGNOSIS — R011 Cardiac murmur, unspecified: Secondary | ICD-10-CM | POA: Insufficient documentation

## 2021-02-25 ENCOUNTER — Ambulatory Visit (INDEPENDENT_AMBULATORY_CARE_PROVIDER_SITE_OTHER): Payer: Medicare Other | Admitting: Cardiology

## 2021-02-25 ENCOUNTER — Other Ambulatory Visit: Payer: Self-pay

## 2021-02-25 ENCOUNTER — Ambulatory Visit (INDEPENDENT_AMBULATORY_CARE_PROVIDER_SITE_OTHER): Payer: Medicare Other

## 2021-02-25 ENCOUNTER — Encounter: Payer: Self-pay | Admitting: Cardiology

## 2021-02-25 VITALS — BP 183/88 | HR 47 | Ht 62.0 in | Wt 300.0 lb

## 2021-02-25 DIAGNOSIS — I498 Other specified cardiac arrhythmias: Secondary | ICD-10-CM

## 2021-02-25 DIAGNOSIS — E782 Mixed hyperlipidemia: Secondary | ICD-10-CM

## 2021-02-25 DIAGNOSIS — G4733 Obstructive sleep apnea (adult) (pediatric): Secondary | ICD-10-CM | POA: Diagnosis not present

## 2021-02-25 DIAGNOSIS — R002 Palpitations: Secondary | ICD-10-CM | POA: Insufficient documentation

## 2021-02-25 DIAGNOSIS — R011 Cardiac murmur, unspecified: Secondary | ICD-10-CM

## 2021-02-25 DIAGNOSIS — I10 Essential (primary) hypertension: Secondary | ICD-10-CM

## 2021-02-25 HISTORY — DX: Other specified cardiac arrhythmias: I49.8

## 2021-02-25 HISTORY — DX: Palpitations: R00.2

## 2021-02-25 MED ORDER — AMLODIPINE BESYLATE 10 MG PO TABS: 10.0000 mg | ORAL_TABLET | Freq: Every day | ORAL | 3 refills | Status: DC

## 2021-02-25 NOTE — Patient Instructions (Signed)
Medication Instructions:  Your physician has recommended you make the following change in your medication:   Start Amlodipine 10 mg take 1/2 tablet daily for 5 mg. Stop Nifedipine  *If you need a refill on your cardiac medications before your next appointment, please call your pharmacy*   Lab Work: None ordered If you have labs (blood work) drawn today and your tests are completely normal, you will receive your results only by: [place] (if you have MyChart) OR A paper copy in the mail If you have any lab test that is abnormal or we need to change your treatment, we will call you to review the results.   Testing/Procedures: Your physician has requested that you have an echocardiogram. Echocardiography is a painless test that uses sound waves to create images of your heart. It provides your doctor with information about the size and shape of your heart and how well your heart's chambers and valves are working. This procedure takes approximately one hour. There are no restrictions for this procedure.   WHY IS MY DOCTOR PRESCRIBING ZIO? The Zio system is proven and trusted by physicians to detect and diagnose irregular heart rhythms -- and has been prescribed to hundreds of thousands of patients.  The FDA has cleared the Zio system to monitor for many different kinds of irregular heart rhythms. In a study, physicians were able to reach a diagnosis 90% of the time with the Zio system1.  You can wear the Zio monitor -- a small, discreet, comfortable patch -- during your normal day-to-day activity, including while you sleep, shower, and exercise, while it records every single heartbeat for analysis.  1Barrett, P., et al. Comparison of 24 Hour Holter Monitoring Versus 14 Day Novel Adhesive Patch Electrocardiographic Monitoring. [place], 2014.  ZIO VS. HOLTER MONITORING The Zio monitor can be comfortably worn for up to 14 days. Holter monitors can be worn for 24 to  48 hours, limiting the time to record any irregular heart rhythms you may have. Zio is able to capture data for the 51% of patients who have their first symptom-triggered arrhythmia after 48 hours.1  LIVE WITHOUT RESTRICTIONS The Zio ambulatory cardiac monitor is a small, unobtrusive, and water-resistant patch--you might even forget you're wearing it. The Zio monitor records and stores every beat of your heart, whether you're sleeping, working out, or showering. Wear the monitor for 7 days, remove   We will order CT coronary calcium score. It will cost $99.00 and is not covered by insurance.  Please call [phone] to schedule.   [facility]  [address], [place] [address]   Follow-Up: At [facility], you and your health needs are our priority.  As part of our continuing mission to provide you with exceptional heart care, we have created designated Provider Care Teams.  These Care Teams include your primary Cardiologist (physician) and Advanced Practice Providers (APPs -  Physician Assistants and Nurse Practitioners) who all work together to provide you with the care you need, when you need it.  We recommend signing up for the patient portal called "MyChart".  Sign up information is provided on this After Visit Summary.  MyChart is used to connect with patients for Virtual Visits (Telemedicine).  Patients are able to view lab/test results, encounter notes, upcoming appointments, etc.  Non-urgent messages can be sent to your provider as well.   To learn more about what you can do with MyChart, go to [email or web address].  Your next appointment:   1 month(s)  The format for your next appointment:   In Person  Provider:   Jyl Heinz, MD   Other Instructions Echocardiogram An echocardiogram is a test that uses sound waves (ultrasound) to produce images of the heart. Images from an echocardiogram can provide important information about: Heart size and  shape. The size and thickness and movement of your heart's walls. Heart muscle function and strength. Heart valve function or if you have stenosis. Stenosis is when the heart valves are too narrow. If blood is flowing backward through the heart valves (regurgitation). A tumor or infectious growth around the heart valves. Areas of heart muscle that are not working well because of poor blood flow or injury from a heart attack. Aneurysm detection. An aneurysm is a weak or damaged part of an artery wall. The wall bulges out from the normal force of blood pumping through the body. Tell a health care provider about: Any allergies you have. All medicines you are taking, including vitamins, herbs, eye drops, creams, and over-the-counter medicines. Any blood disorders you have. Any surgeries you have had. Any medical conditions you have. Whether you are pregnant or may be pregnant. What are the risks? Generally, this is a safe test. However, problems may occur, including an allergic reaction to dye (contrast) that may be used during the test. What happens before the test? No specific preparation is needed. You may eat and drink normally. What happens during the test? You will take off your clothes from the waist up and put on a hospital gown. Electrodes or electrocardiogram (ECG)patches may be placed on your chest. The electrodes or patches are then connected to a device that monitors your heart rate and rhythm. You will lie down on a table for an ultrasound exam. A gel will be applied to your chest to help sound waves pass through your skin. A handheld device, called a transducer, will be pressed against your chest and moved over your heart. The transducer produces sound waves that travel to your heart and bounce back (or "echo" back) to the transducer. These sound waves will be captured in real-time and changed into images of your heart that can be viewed on a video monitor. The images will be  recorded on a computer and reviewed by your health care provider. You may be asked to change positions or hold your breath for a short time. This makes it easier to get different views or better views of your heart. In some cases, you may receive contrast through an IV in one of your veins. This can improve the quality of the pictures from your heart. The procedure may vary among health care providers and hospitals.   What can I expect after the test? You may return to your normal, everyday life, including diet, activities, and medicines, unless your health care provider tells you not to do that. Follow these instructions at home: It is up to you to get the results of your test. Ask your health care provider, or the department that is doing the test, when your results will be ready. Keep all follow-up visits. This is important. Summary An echocardiogram is a test that uses sound waves (ultrasound) to produce images of the heart. Images from an echocardiogram can provide important information about the size and shape of your heart, heart muscle function, heart valve function, and other possible heart problems. You do not need to do anything to prepare before this test. You may eat  and drink normally. After the echocardiogram is completed, you may return to your normal, everyday life, unless your health care provider tells you not to do that. This information is not intended to replace advice given to you by your health care provider. Make sure you discuss any questions you have with your health care provider. Document Revised: 04/06/2020 Document Reviewed: 04/06/2020 Elsevier Patient Education  2021 [employer].

## 2021-02-25 NOTE — Progress Notes (Signed)
Cardiology Office Note:    Date:  02/25/2021   ID:  Tina Villanueva, DOB [birth year], MRN [id number]  PCP:  Villanueva, Tina G, MD  Cardiologist:  Tina Lindau, MD   Referring MD: Villanueva, Tina G, MD    ASSESSMENT:    1. Benign essential hypertension   2. Mixed hyperlipidemia   3. Heart murmur   4. OSA (obstructive sleep apnea)   5. Atrial bigeminy    PLAN:    In order of problems listed above:  Primary prevention stressed with the patient.  Importance of compliance with diet medication stressed and she vocalized understanding.  She was advised to walk on a regular basis to the best of her ability. Morbid obesity: I discussed my findings with the patient at length.  Risks of obesity explained and she promises to do better. Essential hypertension: Blood pressure is elevated.  I will stop nifedipine and initiated amlodipine 10 mg half tablet daily.  Salt intake issues and lifestyle modification urged.  She will keep a track of blood pressures and bring it to me in a month. Mixed dyslipidemia: Lipids were elevated.  I told her about statins and she is vehemently against it.  I will do a calcium CT scoring to understand how aggressive I want to be with lipid-lowering.  She is agreeable.  She will continue Zetia. Palpitations and atrial bigeminy: We will do 1 week monitor to assess this symptoms to see if she has any other arrhythmias.  She is agreeable. Cardiac murmur: Echocardiogram will be done to assess murmur heard on auscultation. Sleep apnea: Sleep health issues were discussed.  This is managed by primary care. Patient will be seen in follow-up appointment in 4 weeks or earlier if the patient has any concerns    Medication Adjustments/Labs and Tests Ordered: Current medicines are reviewed at length with the patient today.  Concerns regarding medicines are outlined above.  No orders of the defined types were placed in this encounter.  No orders of the defined types were placed  in this encounter.    History of Present Illness:    Tina Villanueva is a 70 y.o. female who is being seen today for the evaluation of palpitations, atrial bigeminy at the request of Villanueva, Tina So, MD. patient is a pleasant 70 year old female.  She has past medical history of essential hypertension, dyslipidemia and morbid obesity.  She has atrial bigeminy.  She is occasionally having palpitations.  She leads a sedentary lifestyle.  She is seen for evaluation of for the symptoms.  She leads a sedentary lifestyle but denies any chest pain.  No orthopnea or PND.  She is very mentally achy and statins for elevated lipids because of muscle cramps.  At the time of my evaluation, the patient is alert awake oriented and in no distress.  Past Medical History:  Diagnosis Date   Arthritis    Benign essential hypertension 05/02/2016   Formatting of this note might be different from the original. Last Assessment & Plan:  BP adequately controlled. No changes in current management. Continue low-salt diet.   Bilateral lower extremity edema 03/06/2017   Formatting of this note might be different from the original. Last Assessment & Plan:  Information about vein specialist given. Recommend compression stocking more often,HCTZ 25 mg added. Wt loss may also help.   Bilateral primary osteoarthritis of knee 05/02/2016   BMI 45.0-49.9, adult ([place]) 05/02/2016   Formatting of this note might be different from the original. Last Assessment &  Plan:  She has lost some weight since starting dietary changes. Encouraged to continue a healthful diet and to engage in low impact physical activity. Walking in place is a good option while she is not able to go to the gym.   Chicken pox    Dyslipidemia 09/27/2018   Gastroesophageal reflux disease without esophagitis 09/27/2018   H/O laparoscopic adjustable gastric banding 09/27/2018   Heart murmur    Hyperlipidemia    Hypertension    IFG (impaired fasting glucose) 09/05/2016   Morbid  obesity with BMI of 50.0-59.9, adult ([place]) 05/02/2016   Neuropathy, peripheral 08/09/2017   Formatting of this note might be different from the original. Last Assessment & Plan:  Problem improved with nonpharmacologic treatment. Continue following with chiropractor. Good skin care and feet check periodically.   OSA (obstructive sleep apnea) 05/02/2016   Pain in left knee 09/13/2018    Past Surgical History:  Procedure Laterality Date   BREAST SURGERY     TONSILLECTOMY AND ADENOIDECTOMY      Current Medications: Current Meds  Medication Sig   Cyanocobalamin (VITAMIN B 12 PO) Take 1 tablet by mouth daily.   ezetimibe (ZETIA) 10 MG tablet Take 10 mg by mouth daily.   furosemide (LASIX) 20 MG tablet Take 20 mg by mouth as needed for edema or fluid.   losartan-hydrochlorothiazide (HYZAAR) 100-25 MG tablet Take 1 tablet by mouth daily.   meloxicam (MOBIC) 15 MG tablet Take 15 mg by mouth as needed for pain.   NIFEdipine (PROCARDIA XL/NIFEDICAL-XL) 90 MG 24 hr tablet Take 1 tablet (90 mg total) by mouth daily.     Allergies:   Patient has no known allergies.   Social History   Socioeconomic History   Marital status: Single    Spouse name: Not on file   Number of children: Not on file   Years of education: Not on file   Highest education level: Not on file  Occupational History   Not on file  Tobacco Use   Smoking status: Former    Packs/day: 0.30    Years: 30.00    Pack years: 9.00    Types: Cigarettes   Smokeless tobacco: Never  Substance and Sexual Activity   Alcohol use: Not on file    Comment: social drinker; now not at all    Drug use: Never   Sexual activity: Not on file  Other Topics Concern   Not on file  Social History Narrative   Live alone;    Lives in single level home; one level    Moved here due to retirement x 1 year ago   Brand new house   Walk in tub and oversized shower    Social Determinants of Health   Financial Resource Strain: Not on file  Food  Insecurity: Not on file  Transportation Needs: Not on file  Physical Activity: Not on file  Stress: Not on file  Social Connections: Not on file     Family History: The patient's family history includes Arthritis in her father and mother; Hyperlipidemia in her father and mother; Hypertension in her father and mother; Stroke in her father and mother. There is no history of Breast cancer.  ROS:   Please see the history of present illness.    All other systems reviewed and are negative.  EKGs/Labs/Other Studies Reviewed:    The following studies were reviewed today: EKG reveals sinus rhythm with atrial bigeminy and nonspecific ST-T changes   Recent Labs: 01/19/2021: ALT  14; BUN 15; Creatinine, Ser 0.72; Hemoglobin 12.9; Platelets 290.0; Potassium 3.9; Sodium 141; TSH 2.49  Recent Lipid Panel    Component Value Date/Time   CHOL 242 (H) 01/19/2021 1058   TRIG 138.0 01/19/2021 1058   HDL 60.50 01/19/2021 1058   CHOLHDL 4 01/19/2021 1058   VLDL 27.6 01/19/2021 1058   LDLCALC 154 (H) 01/19/2021 1058    Physical Exam:    VS:  BP (!) 183/88   Pulse (!) 47   Ht 5\' 2"  (1.575 m)   Wt 300 lb (136.1 kg)   SpO2 96%   BMI 54.87 kg/m     Wt Readings from Last 3 Encounters:  02/25/21 300 lb (136.1 kg)  01/18/21 295 lb 2 oz (133.9 kg)  01/06/20 284 lb 4 oz (128.9 kg)     GEN: Patient is in no acute distress HEENT: Normal NECK: No JVD; No carotid bruits LYMPHATICS: No lymphadenopathy CARDIAC: S1 S2 regular, 2/6 systolic murmur at the apex. RESPIRATORY:  Clear to auscultation without rales, wheezing or rhonchi  ABDOMEN: Soft, non-tender, non-distended MUSCULOSKELETAL:  No edema; No deformity  SKIN: Warm and dry NEUROLOGIC:  Alert and oriented x 3 PSYCHIATRIC:  Normal affect    Signed, Tina Lindau, MD  02/25/2021 11:05 AM    [place]

## 2021-03-08 DIAGNOSIS — R002 Palpitations: Secondary | ICD-10-CM | POA: Diagnosis not present

## 2021-03-16 ENCOUNTER — Telehealth: Payer: Self-pay | Admitting: Cardiology

## 2021-03-16 ENCOUNTER — Other Ambulatory Visit: Payer: Self-pay

## 2021-03-16 ENCOUNTER — Other Ambulatory Visit (HOSPITAL_BASED_OUTPATIENT_CLINIC_OR_DEPARTMENT_OTHER): Payer: Medicare Other

## 2021-03-16 ENCOUNTER — Ambulatory Visit (HOSPITAL_BASED_OUTPATIENT_CLINIC_OR_DEPARTMENT_OTHER)
Admission: RE | Admit: 2021-03-16 | Discharge: 2021-03-16 | Disposition: A | Discharge status: Home / Self Care | Payer: Medicare Other | Source: Ambulatory Visit | Attending: Cardiology | Admitting: Cardiology

## 2021-03-16 DIAGNOSIS — R011 Cardiac murmur, unspecified: Secondary | ICD-10-CM | POA: Diagnosis not present

## 2021-03-16 LAB — ECHOCARDIOGRAM COMPLETE
AR max vel: 2.03 cm2
AV Area VTI: 2.35 cm2
AV Area mean vel: 2.1 cm2
AV Mean grad: 8 mmHg
AV Peak grad: 15.5 mmHg
Ao pk vel: 1.97 m/s
Area-P 1/2: 3.68 cm2
Calc EF: 60.9 %
MV M vel: 4.75 m/s
MV Peak grad: 90.3 mmHg
S' Lateral: 2.48 cm
Single Plane A2C EF: 73.5 %
Single Plane A4C EF: 37.4 %

## 2021-03-16 NOTE — Telephone Encounter (Signed)
Results reviewed with pt as per Dr. Revankar's note.  Pt verbalized understanding and had no additional questions.   

## 2021-03-16 NOTE — Telephone Encounter (Signed)
Patient is returning call to discuss echo results. °

## 2021-03-16 NOTE — Progress Notes (Signed)
*  PRELIMINARY RESULTS* Echocardiogram 2D Echocardiogram has been performed.  Luisa Hart RDCS 03/16/2021, 3:05 PM

## 2021-03-24 ENCOUNTER — Ambulatory Visit (INDEPENDENT_AMBULATORY_CARE_PROVIDER_SITE_OTHER)
Admission: RE | Admit: 2021-03-24 | Discharge: 2021-03-24 | Disposition: A | Discharge status: Home / Self Care | Payer: Self-pay | Source: Ambulatory Visit | Attending: Cardiology | Admitting: Cardiology

## 2021-03-24 ENCOUNTER — Other Ambulatory Visit: Payer: Self-pay

## 2021-03-24 DIAGNOSIS — I10 Essential (primary) hypertension: Secondary | ICD-10-CM

## 2021-03-25 ENCOUNTER — Encounter: Payer: Self-pay | Admitting: Cardiology

## 2021-03-25 ENCOUNTER — Ambulatory Visit (INDEPENDENT_AMBULATORY_CARE_PROVIDER_SITE_OTHER): Payer: Medicare Other

## 2021-03-25 ENCOUNTER — Ambulatory Visit (INDEPENDENT_AMBULATORY_CARE_PROVIDER_SITE_OTHER): Payer: Medicare Other | Admitting: Cardiology

## 2021-03-25 VITALS — BP 142/86 | HR 64 | Ht 62.0 in | Wt 301.1 lb

## 2021-03-25 DIAGNOSIS — R002 Palpitations: Secondary | ICD-10-CM

## 2021-03-25 DIAGNOSIS — I77810 Thoracic aortic ectasia: Secondary | ICD-10-CM

## 2021-03-25 DIAGNOSIS — E782 Mixed hyperlipidemia: Secondary | ICD-10-CM | POA: Diagnosis not present

## 2021-03-25 DIAGNOSIS — G4733 Obstructive sleep apnea (adult) (pediatric): Secondary | ICD-10-CM | POA: Diagnosis not present

## 2021-03-25 DIAGNOSIS — Z6841 Body Mass Index (BMI) 40.0 and over, adult: Secondary | ICD-10-CM

## 2021-03-25 DIAGNOSIS — E785 Hyperlipidemia, unspecified: Secondary | ICD-10-CM | POA: Diagnosis not present

## 2021-03-25 DIAGNOSIS — I10 Essential (primary) hypertension: Secondary | ICD-10-CM | POA: Diagnosis not present

## 2021-03-25 MED ORDER — PITAVASTATIN CALCIUM 2 MG PO TABS: 2.0000 mg | ORAL_TABLET | Freq: Every day | ORAL | 3 refills | Status: DC

## 2021-03-25 MED ORDER — CVS COQ-10 200 MG PO CAPS: 200.0000 mg | ORAL_CAPSULE | Freq: Every day | ORAL | 3 refills | Status: AC

## 2021-03-25 NOTE — Patient Instructions (Signed)
Medication Instructions:  Your physician has recommended you make the following change in your medication:   Start CoQ 10 200 mg daily. Start Livalo 2 mg daily.  *If you need a refill on your cardiac medications before your next appointment, please call your pharmacy*   Lab Work: Your physician recommends that you have labs done in the office today. Your test included  basic metabolic panel and liver function.  Your physician recommends that you return for lab work in: 6 weeks You need to have labs done when you are fasting.  You can come Monday through Friday 8:30 am to 12:00 pm and 1:15 to 4:30. You do not need to make an appointment as the order has already been placed. The labs you are going to have done are BMET, LFT and Lipids.   If you have labs (blood work) drawn today and your tests are completely normal, you will receive your results only by: [place] (if you have MyChart) OR A paper copy in the mail If you have any lab test that is abnormal or we need to change your treatment, we will call you to review the results.   Testing/Procedures: Your physician has requested that you have a cardiac MRI. Cardiac MRI uses a computer to create images of your heart as its beating, producing both still and moving pictures of your heart and major blood vessels. For further information please visit [email or web address] Please follow the instruction sheet given to you today for more information.   Your physician has recommended that you wear an event monitor. Event monitors are medical devices that record the heart's electrical activity. Doctors most often [place] these monitors to diagnose arrhythmias. Arrhythmias are problems with the speed or rhythm of the heartbeat. The monitor is a small, portable device. You can wear one while you do your normal daily activities. This is usually used to diagnose what is causing palpitations/syncope (passing out).  Follow-Up: At [facility], you and  your health needs are our priority.  As part of our continuing mission to provide you with exceptional heart care, we have created designated Provider Care Teams.  These Care Teams include your primary Cardiologist (physician) and Advanced Practice Providers (APPs -  Physician Assistants and Nurse Practitioners) who all work together to provide you with the care you need, when you need it.  We recommend signing up for the patient portal called "MyChart".  Sign up information is provided on this After Visit Summary.  MyChart is used to connect with patients for Virtual Visits (Telemedicine).  Patients are able to view lab/test results, encounter notes, upcoming appointments, etc.  Non-urgent messages can be sent to your provider as well.   To learn more about what you can do with MyChart, go to [email or web address].    Your next appointment:   3 month(s)  The format for your next appointment:   In Person  Provider:   Jyl Heinz, MD   Other Instructions NA

## 2021-03-25 NOTE — Progress Notes (Signed)
Cardiology Office Note:    Date:  03/25/2021   ID:  Tina Villanueva, DOB 05-Jul-1951, MRN [id number]  PCP:  Martinique, Betty G, MD  Cardiologist:  Jenean Lindau, MD   Referring MD: Martinique, Betty G, MD    ASSESSMENT:    No diagnosis found. PLAN:    In order of problems listed above:  Primary prevention stressed with the patient.  Importance of compliance with diet medication stressed and she vocalized understanding. Possible atrial arrhythmias: Possible atrial fibrillation: I discussed my findings with the patient in extensive length.  She is not keen on such issues such as anticoagulation at this time unless she gets a better answer and therefore we will do a 1 month monitoring.  Further recommendations will depend on the findings of the test. Elevated calcium score and mixed dyslipidemia: I discussed this extensively with the patient and suggested statin therapy with Livalo 2 mg daily.  Co-Q10 supplementation was encouraged and she is agreeable.  We will do baseline blood work today including Chem-7 and liver panel and recheck blood in 6 weeks for follow-up. Essential hypertension: Blood pressure stable and diet was emphasized. Morbid obesity: Weight reduction stressed and she promises to do better.  Risks of obesity explained. Patient will be seen in follow-up appointment in 6 months or earlier if the patient has any concerns    Medication Adjustments/Labs and Tests Ordered: Current medicines are reviewed at length with the patient today.  Concerns regarding medicines are outlined above.  No orders of the defined types were placed in this encounter.  No orders of the defined types were placed in this encounter.    No chief complaint on file.    History of Present Illness:    Tina Villanueva is a 70 y.o. female.  Patient has past medical history of essential hypertension, atrial bigeminy, dyslipidemia morbid obesity and was recently evaluated for the same.  Her calcium score  revealed elevated calcium score.  She has marked dyslipidemia.  Her calcium score also revealed dilatation of the ascending aorta and an MRA was recommended for cardiac evaluation for the possibility of pulmonary vessel dilatation and pulmonary hypertension.  At the time of my evaluation, the patient is alert awake oriented and in no distress.  Past Medical History:  Diagnosis Date   Arthritis    Atrial bigeminy 02/25/2021   Benign essential hypertension 05/02/2016   Formatting of this note might be different from the original. Last Assessment & Plan:  BP adequately controlled. No changes in current management. Continue low-salt diet.   Bilateral lower extremity edema 03/06/2017   Formatting of this note might be different from the original. Last Assessment & Plan:  Information about vein specialist given. Recommend compression stocking more often,HCTZ 25 mg added. Wt loss may also help.   Bilateral primary osteoarthritis of knee 05/02/2016   BMI 45.0-49.9, adult ([place]) 05/02/2016   Formatting of this note might be different from the original. Last Assessment & Plan:  She has lost some weight since starting dietary changes. Encouraged to continue a healthful diet and to engage in low impact physical activity. Walking in place is a good option while she is not able to go to the gym.   Chicken pox    Dyslipidemia 09/27/2018   Gastroesophageal reflux disease without esophagitis 09/27/2018   H/O laparoscopic adjustable gastric banding 09/27/2018   Heart murmur    Hyperlipidemia    Hypertension    IFG (impaired fasting glucose) 09/05/2016   Morbid obesity with  BMI of 50.0-59.9, adult ([place]) 05/02/2016   Neuropathy, peripheral 08/09/2017   Formatting of this note might be different from the original. Last Assessment & Plan:  Problem improved with nonpharmacologic treatment. Continue following with chiropractor. Good skin care and feet check periodically.   OSA (obstructive sleep apnea) 05/02/2016   Pain in left knee  09/13/2018   Palpitations 02/25/2021    Past Surgical History:  Procedure Laterality Date   BREAST SURGERY     TONSILLECTOMY AND ADENOIDECTOMY      Current Medications: Current Meds  Medication Sig   amLODipine (NORVASC) 10 MG tablet Take 1 tablet (10 mg total) by mouth daily.   ezetimibe (ZETIA) 10 MG tablet Take 10 mg by mouth daily.   furosemide (LASIX) 20 MG tablet Take 20 mg by mouth as needed for edema or fluid.   losartan-hydrochlorothiazide (HYZAAR) 100-25 MG tablet Take 1 tablet by mouth daily.   meloxicam (MOBIC) 15 MG tablet Take 15 mg by mouth as needed for pain.   NATTOKINASE PO Take 1 capsule by mouth daily.     Allergies:   Patient has no known allergies.   Social History   Socioeconomic History   Marital status: Single    Spouse name: Not on file   Number of children: Not on file   Years of education: Not on file   Highest education level: Not on file  Occupational History   Not on file  Tobacco Use   Smoking status: Former    Packs/day: 0.30    Years: 30.00    Pack years: 9.00    Types: Cigarettes   Smokeless tobacco: Never  Substance and Sexual Activity   Alcohol use: Not on file    Comment: social drinker; now not at all    Drug use: Never   Sexual activity: Not on file  Other Topics Concern   Not on file  Social History Narrative   Live alone;    Lives in single level home; one level    Moved here due to retirement x 1 year ago   Brand new house   Walk in tub and oversized shower    Social Determinants of Health   Financial Resource Strain: Not on file  Food Insecurity: Not on file  Transportation Needs: Not on file  Physical Activity: Not on file  Stress: Not on file  Social Connections: Not on file     Family History: The patient's family history includes Arthritis in her father and mother; Hyperlipidemia in her father and mother; Hypertension in her father and mother; Stroke in her father and mother. There is no history of Breast  cancer.  ROS:   Please see the history of present illness.    All other systems reviewed and are negative.  EKGs/Labs/Other Studies Reviewed:    The following studies were reviewed today: IMPRESSIONS     1. Irregular rhythm - possibly atrial fibrillation. Left ventricular  ejection fraction, by estimation, is 60 to 65%. The left ventricle has  normal function. The left ventricle has no regional wall motion  abnormalities. Left ventricular diastolic  parameters are indeterminate.   2. Right ventricular systolic function is normal. The right ventricular  size is normal. There is normal pulmonary artery systolic pressure.   3. Left atrial size was mildly dilated.   4. The mitral valve is normal in structure. Trivial mitral valve  regurgitation. No evidence of mitral stenosis.   5. The aortic valve is normal in structure. Aortic valve  regurgitation is  not visualized. No aortic stenosis is present.   6. The inferior vena cava is normal in size with greater than 50%  respiratory variability, suggesting right atrial pressure of 3 mmHg.    IMPRESSION: 1. Coronary calcium score of 121. This was 79th percentile for age, gender, and race matched controls.   2. Evidence of mild ascending aortic dilation (41 mm) on non-contrasted study. Evidence of moderate main pulmonary dilation on non-contrasted study. Consider secondary imaging modality (echocardiogram, CTA Aorta Protocol, MRA Aorta Protocol) if clinically indicated.   3. Aortic atherosclerosis.     Recent Labs: 01/19/2021: ALT 14; BUN 15; Creatinine, Ser 0.72; Hemoglobin 12.9; Platelets 290.0; Potassium 3.9; Sodium 141; TSH 2.49  Recent Lipid Panel    Component Value Date/Time   CHOL 242 (H) 01/19/2021 1058   TRIG 138.0 01/19/2021 1058   HDL 60.50 01/19/2021 1058   CHOLHDL 4 01/19/2021 1058   VLDL 27.6 01/19/2021 1058   LDLCALC 154 (H) 01/19/2021 1058    Physical Exam:    VS:  BP (!) 142/86   Pulse 64   Ht '5\' 2"'$   (1.575 m)   Wt (!) 301 lb 1.9 oz (136.6 kg)   SpO2 95%   BMI 55.08 kg/m     Wt Readings from Last 3 Encounters:  03/25/21 (!) 301 lb 1.9 oz (136.6 kg)  02/25/21 300 lb (136.1 kg)  01/18/21 295 lb 2 oz (133.9 kg)     GEN: Patient is in no acute distress HEENT: Normal NECK: No JVD; No carotid bruits LYMPHATICS: No lymphadenopathy CARDIAC: Hear sounds regular, 2/6 systolic murmur at the apex. RESPIRATORY:  Clear to auscultation without rales, wheezing or rhonchi  ABDOMEN: Soft, non-tender, non-distended MUSCULOSKELETAL:  No edema; No deformity  SKIN: Warm and dry NEUROLOGIC:  Alert and oriented x 3 PSYCHIATRIC:  Normal affect   Signed, Jenean Lindau, MD  03/25/2021 9:57 AM    [facility]

## 2021-03-26 LAB — BASIC METABOLIC PANEL
BUN/Creatinine Ratio: 17 (ref 12–28)
BUN: 14 mg/dL (ref 8–27)
CO2: 27 mmol/L (ref 20–29)
Calcium: 9.5 mg/dL (ref 8.7–10.3)
Chloride: 103 mmol/L (ref 96–106)
Creatinine, Ser: 0.84 mg/dL (ref 0.57–1.00)
Glucose: 113 mg/dL — ABNORMAL HIGH (ref 65–99)
Potassium: 4.4 mmol/L (ref 3.5–5.2)
Sodium: 142 mmol/L (ref 134–144)
eGFR: 75 mL/min/{1.73_m2} (ref >59)

## 2021-03-26 LAB — HEPATIC FUNCTION PANEL
ALT: 19 IU/L (ref 0–32)
AST: 16 IU/L (ref 0–40)
Albumin: 4.3 g/dL (ref 3.8–4.8)
Alkaline Phosphatase: 113 IU/L (ref 44–121)
Bilirubin Total: 0.6 mg/dL (ref 0.0–1.2)
Bilirubin, Direct: 0.16 mg/dL (ref 0.00–0.40)
Total Protein: 6.8 g/dL (ref 6.0–8.5)

## 2021-03-28 ENCOUNTER — Telehealth: Payer: Self-pay

## 2021-03-28 NOTE — Telephone Encounter (Signed)
-----   Message from Jenean Lindau, MD sent at 03/28/2021  3:32 PM EDT ----- Livalo '2mg'$  and CoQ10 '200mg'$  and Liver lipid in 6 wks cc pcp Jenean Lindau, MD 03/28/2021 3:31 PM

## 2021-03-28 NOTE — Telephone Encounter (Signed)
Spoke with patient regarding results and recommendation.  Patient verbalizes understanding and is agreeable to plan of care. Advised patient to call back with any issues or concerns.  

## 2021-03-29 ENCOUNTER — Telehealth: Payer: Self-pay | Admitting: Cardiology

## 2021-03-29 NOTE — Telephone Encounter (Signed)
Pt is calling to find out why she needs to come to [facility]

## 2021-03-29 NOTE — Telephone Encounter (Signed)
Spoke to the patient just now and let her know that she should be fine to drive to our office here in [place] as Dr. Geraldo Pitter did not mention that she could not drive at this time.    Encouraged patient to call back with any questions or concerns.

## 2021-03-30 ENCOUNTER — Other Ambulatory Visit: Payer: Self-pay

## 2021-03-30 ENCOUNTER — Encounter: Payer: Self-pay | Admitting: Cardiology

## 2021-03-30 ENCOUNTER — Ambulatory Visit (INDEPENDENT_AMBULATORY_CARE_PROVIDER_SITE_OTHER): Payer: Medicare Other | Admitting: Cardiology

## 2021-03-30 VITALS — BP 138/76 | HR 78 | Ht 62.0 in | Wt 308.0 lb

## 2021-03-30 DIAGNOSIS — I2510 Atherosclerotic heart disease of native coronary artery without angina pectoris: Secondary | ICD-10-CM | POA: Insufficient documentation

## 2021-03-30 DIAGNOSIS — Z6841 Body Mass Index (BMI) 40.0 and over, adult: Secondary | ICD-10-CM | POA: Diagnosis not present

## 2021-03-30 DIAGNOSIS — I480 Paroxysmal atrial fibrillation: Secondary | ICD-10-CM | POA: Diagnosis not present

## 2021-03-30 DIAGNOSIS — E782 Mixed hyperlipidemia: Secondary | ICD-10-CM | POA: Diagnosis not present

## 2021-03-30 DIAGNOSIS — G4733 Obstructive sleep apnea (adult) (pediatric): Secondary | ICD-10-CM

## 2021-03-30 DIAGNOSIS — I10 Essential (primary) hypertension: Secondary | ICD-10-CM | POA: Diagnosis not present

## 2021-03-30 HISTORY — DX: Paroxysmal atrial fibrillation: I48.0

## 2021-03-30 HISTORY — DX: Atherosclerotic heart disease of native coronary artery without angina pectoris: I25.10

## 2021-03-30 MED ORDER — RIVAROXABAN 20 MG PO TABS: 20.0000 mg | ORAL_TABLET | Freq: Every day | ORAL | 2 refills | Status: DC

## 2021-03-30 NOTE — Patient Instructions (Addendum)
Medication Instructions:  Start Xarelto 20 mg daily with supper *If you need a refill on your cardiac medications before your next appointment, please call your pharmacy*   Lab Work: Stool sample If you have labs (blood work) drawn today and your tests are completely normal, you will receive your results only by: [place] (if you have MyChart) OR A paper copy in the mail If you have any lab test that is abnormal or we need to change your treatment, we will call you to review the results.   Testing/Procedures: Referral to sleep specialists. They will call you to schedule an appointment.    Follow-Up: At [facility], you and your health needs are our priority.  As part of our continuing mission to provide you with exceptional heart care, we have created designated Provider Care Teams.  These Care Teams include your primary Cardiologist (physician) and Advanced Practice Providers (APPs -  Physician Assistants and Nurse Practitioners) who all work together to provide you with the care you need, when you need it.  We recommend signing up for the patient portal called "MyChart".  Sign up information is provided on this After Visit Summary.  MyChart is used to connect with patients for Virtual Visits (Telemedicine).  Patients are able to view lab/test results, encounter notes, upcoming appointments, etc.  Non-urgent messages can be sent to your provider as well.   To learn more about what you can do with MyChart, go to [email or web address].    Your next appointment:   2 month(s)  The format for your next appointment:   In Person  Provider:   Jyl Heinz, MD   Other Instructions Rivaroxaban Tablets What is this medication? RIVAROXABAN (ri va ROX a ban) prevents or treats blood clots. It is also used to lower the risk of stroke in people with AFib (atrial fibrillation). It can be used to lower the risk of heart attack or stroke in people with heart or peripheral artery  disease. It belongs to a group of medications called bloodthinners. This medicine may be used for other purposes; ask your health care provider orpharmacist if you have questions. COMMON BRAND NAME(S): Xarelto, Xarelto Starter Pack What should I tell my care team before I take this medication? They need to know if you have any of these conditions: Antiphospholipid antibody syndrome Bleeding disorders Bleeding in the brain Blood clots Kidney disease Liver disease Prosthetic heart valve Recent or planned spinal or epidural procedure Stomach bleeding Take medications that treat or prevent blood clots An unusual or allergic reaction to rivaroxaban, other medications, foods, dyes, or preservatives Pregnant or trying to get pregnant Breast-feeding How should I use this medication? Take this medication by mouth. For your therapy to work as well as possible, take each dose exactly as prescribed on the prescription label. Do not skip doses. Skipping doses or stopping this medication can increase your risk of a blood clot or stroke. Keep taking this medication unless your care team tellsyou to stop. If you are taking this medication after hip or knee replacement surgery, take it with or without food. If you are taking this medication for atrial fibrillation, take it with your evening meal. If you are taking this medication to treat blood clots, take it with food at the same time each day. If you are taking this medication for coronary artery disease or peripheral artery disease, take it with or without food at the same time every day. If you are unable to swallow your tablet, you  may crush the tablet and mix it in applesauce. Then, immediately eat the applesauce. You should eat more foodright after you eat the applesauce containing the crushed tablet. A special MedGuide will be given to you by the pharmacist with eachprescription and refill. Be sure to read this information carefully each time. Talk to  your care team about the use of this medication in children. While it may be prescribed for children as young as newborns for selected conditions,precautions do apply. Overdosage: If you think you have taken too much of this medicine contact apoison control center or emergency room at once. NOTE: This medicine is only for you. Do not share this medicine with others. What if I miss a dose? If you take your medication once a day and miss a dose, take it as soon as you can. If it is almost time for your next dose, take only that dose. Do not takedouble or extra doses. If you are taking this medication twice a day to treat blood clots and miss a dose, take the missed dose as soon as you remember. In this instance, 2 tablets may be taken at the same time. The next day you should take 1 tablet twice aday. If you are taking this medication twice a day for coronary artery disease or peripheral artery disease and miss a dose, skip it. Take your next dose at the normal time. Do not take extra or 2 doses at the same time to make up for themissed dose. What may interact with this medication? Do not take this medication with any of the following: Defibrotide This medication may also interact with the following: Aspirin and aspirin-like medications Certain antibiotics like erythromycin and clarithromycin Certain medications for fungal infections like ketoconazole and itraconazole Certain medications for seizures like carbamazepine, phenytoin Certain medications that treat or prevent blood clots like warfarin, enoxaparin, dalteparin, apixaban, dabigatran, and edoxaban Conivaptan Indinavir Lopinavir; ritonavir NSAIDS, medications for pain and inflammation, like ibuprofen or naproxen Rifampin Ritonavir SNRIs, medications for depression, like desvenlafaxine, duloxetine, levomilnacipran, venlafaxine SSRIs, medications for depression, like citalopram, escitalopram, fluoxetine, fluvoxamine, paroxetine,  sertraline St. John's wort This list may not describe all possible interactions. Give your health care provider a list of all the medicines, herbs, non-prescription drugs, or dietary supplements you use. Also tell them if you smoke, drink alcohol, or use illegaldrugs. Some items may interact with your medicine. What should I watch for while using this medication? Visit your care team for regular checks on your progress. You may need blood work done while you are taking this medication. Your condition will be monitored carefully while you are receiving this medication. It is importantnot to miss any appointments. Avoid sports and activities that might cause injury while you are using this medication. Severe falls or injuries can cause unseen bleeding. Be careful when using sharp tools or knives. Consider using an Copy. Take special care brushing or flossing your teeth. Report any injuries, bruising, or redspots on the skin to your care team. Wear a medical ID bracelet or chain. Carry a card that describes yourcondition. List the medications and doses you take on the card. Tell your dentist and dental surgeon that you are taking this medication. You should not have major dental surgery while on this medication. See your dentist to have a dental exam and fix any dental problems before starting this medication. Take good care of your teeth while on this medication. Make sureyou see your dentist for regular follow-up appointments. If you are  going to need surgery or other procedure, tell your care team thatyou are using this medication. Do not become pregnant while taking this medication. Women should inform their care team if they wish to become pregnant or think they might be pregnant. There is potential for serious harm to an unborn child. Talk to your care teamfor more information. What side effects may I notice from receiving this medication? Side effects that you should report to your care team  as soon as possible: Allergic reactions-skin rash, itching, hives, swelling of the face, lips, tongue, or throat Bleeding-bloody or black, tar-like stools, vomiting blood or brown material that looks like coffee grounds, red or dark brown urine, small red or purple spots on skin, unusual bruising or bleeding Bleeding in the brain-severe headache, stiff neck, confusion, dizziness, change in vision, numbness or weakness of the face, arm, or leg, trouble speaking, trouble walking, vomiting Heavy periods This list may not describe all possible side effects. Call your doctor for medical advice about side effects. You may report side effects to FDA at1-800-FDA-1088. Where should I keep my medication? Keep out of the reach of children and pets. Store at room temperature between 20 and 25 degrees C (68 and 77 degrees F).Get rid of any unused medication after the expiration date. To get rid of medications that are no longer needed or have expired: Take the medication to a medication take-back program. Check your pharmacy or law enforcement to find a location. If you cannot return the medication, check the label or package insert to see if the medication should be thrown out in the garbage or flushed down the toilet. If you are not sure, ask your care team. If it is safe to put it in the trash, empty the medication out of the container. Mix the medication with cat litter, dirt, coffee grounds, or other unwanted substance. Seal the mixture in a bag or container. Put it in the trash. NOTE: This sheet is a summary. It may not cover all possible information. If you have questions about this medicine, talk to your doctor, pharmacist, orhealth care provider.  2022 Elsevier/Gold Standard (2020-08-25 11:02:59)

## 2021-03-30 NOTE — Progress Notes (Signed)
Cardiology Office Note:    Date:  03/30/2021   ID:  Tina Villanueva, DOB 1951-08-04, MRN [id number]  PCP:  Martinique, Betty G, MD  Cardiologist:  Jenean Lindau, MD   Referring MD: Martinique, Betty G, MD    ASSESSMENT:    1. Benign essential hypertension   2. Mixed hyperlipidemia   3. Morbid obesity with BMI of 50.0-59.9, adult ([place])   4. OSA (obstructive sleep apnea)   5. Coronary artery disease involving native coronary artery of native heart without angina pectoris   6. Paroxysmal atrial fibrillation (HCC)    PLAN:    In order of problems listed above:  Coronary artery disease: CT coronary angiography report was discussed with her at length.  Results are mentioned below and she understands.  Questions were answered to her satisfaction.  Secondary prevention stressed.  Importance of compliance with diet medication stressed and she vocalized understanding. Essential hypertension: Blood pressure stable and diet was emphasized. Paroxysmal atrial fibrillation:I discussed with the patient atrial fibrillation, disease process. Management and therapy including rate and rhythm control, anticoagulation benefits and potential risks were discussed extensively with the patient. Patient had multiple questions which were answered to patient's satisfaction.  I noted that her thyroid testing was fine. Mixed dyslipidemia: Patient has agreed to take a statin and will initiate pitavastatin 2 mg daily and will be back in the liver lipid check: 6 weeks Obesity: Diet was emphasized weight reduction stressed and she promises to comply Sleep apnea: Patient gives history of sleep apnea diagnosed in the past.  She is not compliant with therapy and we will refer her to a sleep specialist for evaluation for this.  She has significant issues with snoring. Patient will be seen in follow-up appointment in 6 months or earlier if the patient has any concerns    Medication Adjustments/Labs and Tests Ordered: Current  medicines are reviewed at length with the patient today.  Concerns regarding medicines are outlined above.  No orders of the defined types were placed in this encounter.  No orders of the defined types were placed in this encounter.    No chief complaint on file.    History of Present Illness:    Tina Villanueva is a 70 y.o. female.  Patient has past medical history of essential hypertension, dyslipidemia, diabetes mellitus and morbid obesity.  She has significant calcifications in her coronary arteries.  She also is diagnosed with paroxysmal atrial fibrillation by echocardiographic findings and also monitoring.  So she was called here for evaluation.  At the time of my evaluation, the patient is alert awake oriented and in no distress.  Past Medical History:  Diagnosis Date   Arthritis    Atrial bigeminy 02/25/2021   Benign essential hypertension 05/02/2016   Formatting of this note might be different from the original. Last Assessment & Plan:  BP adequately controlled. No changes in current management. Continue low-salt diet.   Bilateral lower extremity edema 03/06/2017   Formatting of this note might be different from the original. Last Assessment & Plan:  Information about vein specialist given. Recommend compression stocking more often,HCTZ 25 mg added. Wt loss may also help.   Bilateral primary osteoarthritis of knee 05/02/2016   BMI 45.0-49.9, adult ([place]) 05/02/2016   Formatting of this note might be different from the original. Last Assessment & Plan:  She has lost some weight since starting dietary changes. Encouraged to continue a healthful diet and to engage in low impact physical activity. Walking in place is  a good option while she is not able to go to the gym.   Chicken pox    Dyslipidemia 09/27/2018   Gastroesophageal reflux disease without esophagitis 09/27/2018   H/O laparoscopic adjustable gastric banding 09/27/2018   Heart murmur    Hyperlipidemia    Hypertension    IFG  (impaired fasting glucose) 09/05/2016   Morbid obesity with BMI of 50.0-59.9, adult ([place]) 05/02/2016   Neuropathy, peripheral 08/09/2017   Formatting of this note might be different from the original. Last Assessment & Plan:  Problem improved with nonpharmacologic treatment. Continue following with chiropractor. Good skin care and feet check periodically.   OSA (obstructive sleep apnea) 05/02/2016   Pain in left knee 09/13/2018   Palpitations 02/25/2021    Past Surgical History:  Procedure Laterality Date   BREAST SURGERY     TONSILLECTOMY AND ADENOIDECTOMY      Current Medications: Current Meds  Medication Sig   amLODipine (NORVASC) 10 MG tablet Take 1 tablet (10 mg total) by mouth daily.   Coenzyme Q10 ([employer] COQ-10) 200 MG capsule Take 1 capsule (200 mg total) by mouth daily.   furosemide (LASIX) 20 MG tablet Take 20 mg by mouth as needed for edema or fluid.   losartan-hydrochlorothiazide (HYZAAR) 100-25 MG tablet Take 1 tablet by mouth daily.   meloxicam (MOBIC) 15 MG tablet Take 15 mg by mouth as needed for pain.   NATTOKINASE PO Take 1 capsule by mouth daily.   Pitavastatin Calcium 2 MG TABS Take 1 tablet (2 mg total) by mouth daily.     Allergies:   Patient has no known allergies.   Social History   Socioeconomic History   Marital status: Single    Spouse name: Not on file   Number of children: Not on file   Years of education: Not on file   Highest education level: Not on file  Occupational History   Not on file  Tobacco Use   Smoking status: Former    Packs/day: 0.30    Years: 30.00    Pack years: 9.00    Types: Cigarettes   Smokeless tobacco: Never  Substance and Sexual Activity   Alcohol use: Not on file    Comment: social drinker; now not at all    Drug use: Never   Sexual activity: Not on file  Other Topics Concern   Not on file  Social History Narrative   Live alone;    Lives in single level home; one level    Moved here due to retirement x 1 year ago    Brand new house   Walk in tub and oversized shower    Social Determinants of Health   Financial Resource Strain: Not on file  Food Insecurity: Not on file  Transportation Needs: Not on file  Physical Activity: Not on file  Stress: Not on file  Social Connections: Not on file     Family History: The patient's family history includes Arthritis in her father and mother; Hyperlipidemia in her father and mother; Hypertension in her father and mother; Stroke in her father and mother. There is no history of Breast cancer.  ROS:   Please see the history of present illness.    All other systems reviewed and are negative.  EKGs/Labs/Other Studies Reviewed:    The following studies were reviewed today: IMPRESSIONS     1. Irregular rhythm - possibly atrial fibrillation. Left ventricular  ejection fraction, by estimation, is 60 to 65%. The left ventricle has  normal function. The left ventricle has no regional wall motion  abnormalities. Left ventricular diastolic  parameters are indeterminate.   2. Right ventricular systolic function is normal. The right ventricular  size is normal. There is normal pulmonary artery systolic pressure.   3. Left atrial size was mildly dilated.   4. The mitral valve is normal in structure. Trivial mitral valve  regurgitation. No evidence of mitral stenosis.   5. The aortic valve is normal in structure. Aortic valve regurgitation is  not visualized. No aortic stenosis is present.   6. The inferior vena cava is normal in size with greater than 50%  respiratory variability, suggesting right atrial pressure of 3 mmHg.    IMPRESSION: 1. Coronary calcium score of 121. This was 79th percentile for age, gender, and race matched controls.   2. Evidence of mild ascending aortic dilation (41 mm) on non-contrasted study. Evidence of moderate main pulmonary dilation on non-contrasted study. Consider secondary imaging modality (echocardiogram, CTA Aorta Protocol,  MRA Aorta Protocol) if clinically indicated.   3. Aortic atherosclerosis.     Recent Labs: 01/19/2021: Hemoglobin 12.9; Platelets 290.0; TSH 2.49 03/25/2021: ALT 19; BUN 14; Creatinine, Ser 0.84; Potassium 4.4; Sodium 142  Recent Lipid Panel    Component Value Date/Time   CHOL 242 (H) 01/19/2021 1058   TRIG 138.0 01/19/2021 1058   HDL 60.50 01/19/2021 1058   CHOLHDL 4 01/19/2021 1058   VLDL 27.6 01/19/2021 1058   LDLCALC 154 (H) 01/19/2021 1058    Physical Exam:    VS:  BP 138/76   Pulse 78   Ht '5\' 2"'$  (1.575 m)   Wt (!) 308 lb (139.7 kg)   SpO2 94%   BMI 56.33 kg/m     Wt Readings from Last 3 Encounters:  03/30/21 (!) 308 lb (139.7 kg)  03/25/21 (!) 301 lb 1.9 oz (136.6 kg)  02/25/21 300 lb (136.1 kg)     GEN: Patient is in no acute distress HEENT: Normal NECK: No JVD; No carotid bruits LYMPHATICS: No lymphadenopathy CARDIAC: Hear sounds regular, 2/6 systolic murmur at the apex. RESPIRATORY:  Clear to auscultation without rales, wheezing or rhonchi  ABDOMEN: Soft, non-tender, non-distended MUSCULOSKELETAL:  No edema; No deformity  SKIN: Warm and dry NEUROLOGIC:  Alert and oriented x 3 PSYCHIATRIC:  Normal affect   Signed, Jenean Lindau, MD  03/30/2021 12:24 PM    [facility]

## 2021-03-30 NOTE — Addendum Note (Signed)
Addended by: Ruble Buttler, Jonelle Sidle L on: 03/30/2021 02:39 PM   Modules accepted: Orders

## 2021-04-04 DIAGNOSIS — I480 Paroxysmal atrial fibrillation: Secondary | ICD-10-CM | POA: Diagnosis not present

## 2021-04-08 ENCOUNTER — Telehealth: Payer: Self-pay | Admitting: Family Medicine

## 2021-04-08 ENCOUNTER — Telehealth: Payer: Self-pay | Admitting: Cardiology

## 2021-04-08 ENCOUNTER — Other Ambulatory Visit: Payer: Self-pay

## 2021-04-08 DIAGNOSIS — R195 Other fecal abnormalities: Secondary | ICD-10-CM

## 2021-04-08 LAB — FECAL OCCULT BLOOD, IMMUNOCHEMICAL: Fecal Occult Bld: POSITIVE — AB

## 2021-04-08 NOTE — Telephone Encounter (Signed)
PT called to advise that the cardiology called to advise that the PT had blood in their fecal and would like for her PCP to put in orders for her to have a CBC test done.

## 2021-04-08 NOTE — Telephone Encounter (Signed)
Handicap placard has been signed & placed up front. Patient is aware.

## 2021-04-08 NOTE — Telephone Encounter (Signed)
    Pt is calling to ask Dr. Geraldo Pitter if she still need to do blood work, her pcp referred her to a GI doctor

## 2021-04-08 NOTE — Telephone Encounter (Signed)
I called and spoke with patient. She is not seeing any blood in her stool, so referral to gastroenterology will be made. Pt is aware to let us know if she sees any blood in her stool. Pt also needs her handicap placard renewed, form filled out for pcp to sign.

## 2021-04-08 NOTE — Addendum Note (Signed)
Addended by: Rodrigo Ran on: 04/08/2021 02:52 PM   Modules accepted: Orders

## 2021-04-11 ENCOUNTER — Telehealth: Payer: Self-pay

## 2021-04-11 DIAGNOSIS — R195 Other fecal abnormalities: Secondary | ICD-10-CM | POA: Diagnosis not present

## 2021-04-12 LAB — CBC WITH DIFFERENTIAL/PLATELET
Basophils Absolute: 0 10*3/uL (ref 0.0–0.2)
Basos: 1 %
EOS (ABSOLUTE): 0.2 10*3/uL (ref 0.0–0.4)
Eos: 3 %
Hematocrit: 38.1 % (ref 34.0–46.6)
Hemoglobin: 12.5 g/dL (ref 11.1–15.9)
Immature Grans (Abs): 0 10*3/uL (ref 0.0–0.1)
Immature Granulocytes: 0 %
Lymphocytes Absolute: 1.8 10*3/uL (ref 0.7–3.1)
Lymphs: 26 %
MCH: 30.6 pg (ref 26.6–33.0)
MCHC: 32.8 g/dL (ref 31.5–35.7)
MCV: 93 fL (ref 79–97)
Monocytes Absolute: 0.5 10*3/uL (ref 0.1–0.9)
Monocytes: 7 %
Neutrophils Absolute: 4.4 10*3/uL (ref 1.4–7.0)
Neutrophils: 63 %
Platelets: 305 10*3/uL (ref 150–450)
RBC: 4.08 x10E6/uL (ref 3.77–5.28)
RDW: 12.8 % (ref 11.7–15.4)
WBC: 6.9 10*3/uL (ref 3.4–10.8)

## 2021-04-13 ENCOUNTER — Telehealth: Payer: Self-pay

## 2021-04-13 NOTE — Telephone Encounter (Signed)
Pt called the office this afternoon States that she received a bill for her visit with Dr Martinique back in 01/18/2021 . She says that Medicare told her that the visit was coded wrong, should have been billed as Annual Wellness visit and not Routine general physical. Pt was advised that she will receive a call from the office regarding this matter. Message was sent to [facility] for advise

## 2021-04-27 ENCOUNTER — Telehealth: Payer: Self-pay | Admitting: Cardiology

## 2021-04-27 ENCOUNTER — Encounter: Payer: Self-pay | Admitting: Internal Medicine

## 2021-04-27 ENCOUNTER — Other Ambulatory Visit: Payer: Self-pay

## 2021-04-27 ENCOUNTER — Ambulatory Visit (INDEPENDENT_AMBULATORY_CARE_PROVIDER_SITE_OTHER): Payer: Medicare Other | Admitting: Internal Medicine

## 2021-04-27 VITALS — BP 122/82 | HR 88 | Temp 97.7°F | Wt 307.1 lb

## 2021-04-27 DIAGNOSIS — K625 Hemorrhage of anus and rectum: Secondary | ICD-10-CM

## 2021-04-27 DIAGNOSIS — I2510 Atherosclerotic heart disease of native coronary artery without angina pectoris: Secondary | ICD-10-CM | POA: Diagnosis not present

## 2021-04-27 LAB — CBC WITH DIFFERENTIAL/PLATELET
Basophils Absolute: 0 10*3/uL (ref 0.0–0.1)
Basophils Relative: 0.6 % (ref 0.0–3.0)
Eosinophils Absolute: 0.5 10*3/uL (ref 0.0–0.7)
Eosinophils Relative: 6.3 % — ABNORMAL HIGH (ref 0.0–5.0)
HCT: 35.3 % — ABNORMAL LOW (ref 36.0–46.0)
Hemoglobin: 11.6 g/dL — ABNORMAL LOW (ref 12.0–15.0)
Lymphocytes Relative: 23.1 % (ref 12.0–46.0)
Lymphs Abs: 1.7 10*3/uL (ref 0.7–4.0)
MCHC: 32.8 g/dL (ref 30.0–36.0)
MCV: 91.4 fl (ref 78.0–100.0)
Monocytes Absolute: 0.5 10*3/uL (ref 0.1–1.0)
Monocytes Relative: 6.4 % (ref 3.0–12.0)
Neutro Abs: 4.6 10*3/uL (ref 1.4–7.7)
Neutrophils Relative %: 63.6 % (ref 43.0–77.0)
Platelets: 271 10*3/uL (ref 150.0–400.0)
RBC: 3.86 Mil/uL — ABNORMAL LOW (ref 3.87–5.11)
RDW: 13.6 % (ref 11.5–15.5)
WBC: 7.2 10*3/uL (ref 4.0–10.5)

## 2021-04-27 NOTE — Telephone Encounter (Addendum)
Pt states she passed a large blood clot in her stool this morning. Advised to call PCP or go to the ED for evaluation as she recently had a positive fecal occult sample and is on Xarelto. Pt verbalized understanding and had no additional questions.

## 2021-04-27 NOTE — Progress Notes (Signed)
Acute office Visit     This visit occurred during the SARS-CoV-2 public health emergency.  Safety protocols were in place, including screening questions prior to the visit, additional usage of staff PPE, and extensive cleaning of exam room while observing appropriate contact time as indicated for disinfecting solutions.    CC/Reason for Visit: Rectal bleeding  HPI: Tina Villanueva is a 70 y.o. female who is coming in today for the above mentioned reasons. Past Medical History is significant for: Morbid obesity, recent diagnosis of atrial fibrillation now on Xarelto for about 3 weeks.  This morning she thought she was going to have diarrhea and had to run to the toilet.  When she looked into the toilet bowl it was full of bright red blood and blood clots.  She became alarmed.  Called her cardiologist who advised her to discontinue Xarelto and come see [place].  She has had 1 more episode today.  This time it was more blood mixed in with stool than frank blood.  This is painless.  She denies dyspepsia type symptoms.  No nausea or vomiting.  Her last colonoscopy was over 10 years ago in [facility] and we do not have any records of this.   Past Medical/Surgical History: Past Medical History:  Diagnosis Date   Arthritis    Atrial bigeminy 02/25/2021   Benign essential hypertension 05/02/2016   Formatting of this note might be different from the original. Last Assessment & Plan:  BP adequately controlled. No changes in current management. Continue low-salt diet.   Bilateral lower extremity edema 03/06/2017   Formatting of this note might be different from the original. Last Assessment & Plan:  Information about vein specialist given. Recommend compression stocking more often,HCTZ 25 mg added. Wt loss may also help.   Bilateral primary osteoarthritis of knee 05/02/2016   BMI 45.0-49.9, adult ([place]) 05/02/2016   Formatting of this note might be different from the original. Last Assessment & Plan:  She has  lost some weight since starting dietary changes. Encouraged to continue a healthful diet and to engage in low impact physical activity. Walking in place is a good option while she is not able to go to the gym.   Chicken pox    Dyslipidemia 09/27/2018   Gastroesophageal reflux disease without esophagitis 09/27/2018   H/O laparoscopic adjustable gastric banding 09/27/2018   Heart murmur    Hyperlipidemia    Hypertension    IFG (impaired fasting glucose) 09/05/2016   Morbid obesity with BMI of 50.0-59.9, adult ([place]) 05/02/2016   Neuropathy, peripheral 08/09/2017   Formatting of this note might be different from the original. Last Assessment & Plan:  Problem improved with nonpharmacologic treatment. Continue following with chiropractor. Good skin care and feet check periodically.   OSA (obstructive sleep apnea) 05/02/2016   Pain in left knee 09/13/2018   Palpitations 02/25/2021    Past Surgical History:  Procedure Laterality Date   BREAST SURGERY     TONSILLECTOMY AND ADENOIDECTOMY      Social History:  reports that she has quit smoking. Her smoking use included cigarettes. She has a 9.00 pack-year smoking history. She has never used smokeless tobacco. She reports that she does not use drugs. No history on file for alcohol use.  Allergies: No Known Allergies  Family History:  Family History  Problem Relation Age of Onset   Arthritis Mother    Hyperlipidemia Mother    Hypertension Mother    Stroke Mother  Arthritis Father    Hyperlipidemia Father    Hypertension Father    Stroke Father    Breast cancer Neg Hx      Current Outpatient Medications:    amLODipine (NORVASC) 10 MG tablet, Take 1 tablet (10 mg total) by mouth daily., Disp: 180 tablet, Rfl: 3   Coenzyme Q10 ([employer] COQ-10) 200 MG capsule, Take 1 capsule (200 mg total) by mouth daily., Disp: 90 capsule, Rfl: 3   furosemide (LASIX) 20 MG tablet, Take 20 mg by mouth as needed for edema or fluid., Disp: , Rfl:     losartan-hydrochlorothiazide (HYZAAR) 100-25 MG tablet, Take 1 tablet by mouth daily., Disp: 90 tablet, Rfl: 3   meloxicam (MOBIC) 15 MG tablet, Take 15 mg by mouth as needed for pain., Disp: , Rfl:    Pitavastatin Calcium 2 MG TABS, Take 1 tablet (2 mg total) by mouth daily., Disp: 90 tablet, Rfl: 3   rivaroxaban (XARELTO) 20 MG TABS tablet, Take 1 tablet (20 mg total) by mouth daily with supper., Disp: 30 tablet, Rfl: 2  Review of Systems:  Constitutional: Denies fever, chills, diaphoresis, appetite change and fatigue.  HEENT: Denies photophobia, eye pain, redness, hearing loss, ear pain, congestion, sore throat, rhinorrhea, sneezing, mouth sores, trouble swallowing, neck pain, neck stiffness and tinnitus.   Respiratory: Denies SOB, DOE, cough, chest tightness,  and wheezing.   Cardiovascular: Denies chest pain, palpitations and leg swelling.  Gastrointestinal: Denies nausea, vomiting, abdominal pain, constipation and abdominal distention.  Genitourinary: Denies dysuria, urgency, frequency, hematuria, flank pain and difficulty urinating.  Endocrine: Denies: hot or cold intolerance, sweats, changes in hair or nails, polyuria, polydipsia. Musculoskeletal: Denies myalgias, back pain, joint swelling, arthralgias and gait problem.  Skin: Denies pallor, rash and wound.  Neurological: Denies dizziness, seizures, syncope, weakness, light-headedness, numbness and headaches.  Hematological: Denies adenopathy. Easy bruising, personal or family bleeding history  Psychiatric/Behavioral: Denies suicidal ideation, mood changes, confusion, nervousness, sleep disturbance and agitation    Physical Exam: Vitals:   04/27/21 1256  BP: 122/82  Pulse: 88  Temp: 97.7 F (36.5 C)  TempSrc: Oral  SpO2: 98%  Weight: (!) 307 lb 1.6 oz (139.3 kg)    Body mass index is 56.17 kg/m.   Constitutional: NAD, calm, comfortable, obese Eyes: PERRL, lids and conjunctivae normal ENMT: Mucous membranes are moist.   Respiratory: clear to auscultation bilaterally, no wheezing, no crackles. Normal respiratory effort. No accessory muscle use.  Cardiovascular: Regular rate and rhythm, no murmurs / rubs / gallops. No extremity edema. 2+ pedal pulses. No carotid bruits.  Abdomen: no tenderness, no masses palpated. No hepatosplenomegaly. Bowel sounds positive.  Neurologic: Grossly intact and nonfocal Psychiatric: Normal judgment and insight. Alert and oriented x 3. Normal mood.    Impression and Plan:  Rectal bleed  -Given painless in quantity suspect diverticular. -Check CBC today. -I have placed a referral to GI.  If rectal bleeding continues to dissipate, I believe it is safe for her to see GI in the outpatient setting. -She is informed however that if bleeding is picking up or if she develops any symptoms of rapid anemia such as dizziness, palpitations, lightheadedness, low blood pressure, that she should proceed immediately to the emergency department for urgent evaluation. -I agree with continuing to hold Xarelto for now until we can define colon anatomy. -I have also asked her to hold meloxicam on file and to avoid any OTC NSAID use for now.  Time spent: 31 minutes reviewing chart, interviewing patient and daughter,  examining patient and formulating plan of care.     Lelon Frohlich, MD [facility] at [facility]

## 2021-04-27 NOTE — Telephone Encounter (Signed)
Left VM to call back 

## 2021-04-27 NOTE — Telephone Encounter (Signed)
   Pt said she said she woke up feeling need to go to the bathroom, she went and she thought she was having diarrhea and its actually blood came out of her. She wanted to speak with a nurse to know what she needs to do

## 2021-04-27 NOTE — Addendum Note (Signed)
Addended by: Amanda Cockayne on: 04/27/2021 01:58 PM   Modules accepted: Orders

## 2021-05-17 ENCOUNTER — Telehealth: Payer: Self-pay | Admitting: *Deleted

## 2021-05-17 NOTE — Telephone Encounter (Signed)
Called pt to schedule her AWV and she declined states that she already had her CPE and that's all she needs.

## 2021-05-19 ENCOUNTER — Ambulatory Visit (INDEPENDENT_AMBULATORY_CARE_PROVIDER_SITE_OTHER): Payer: Medicare Other | Admitting: Internal Medicine

## 2021-05-19 ENCOUNTER — Encounter: Payer: Self-pay | Admitting: Internal Medicine

## 2021-05-19 DIAGNOSIS — K625 Hemorrhage of anus and rectum: Secondary | ICD-10-CM

## 2021-05-19 DIAGNOSIS — R195 Other fecal abnormalities: Secondary | ICD-10-CM | POA: Diagnosis not present

## 2021-05-19 NOTE — Progress Notes (Signed)
Chief Complaint: Positive FIT test, rectal bleeding  HPI : 70 year old female with history of obesity (BMI 54.9) s/p lap band, DVT previously on Eliquis, GERD, OSA presents with rectal bleeding and a positive FIT test  She developed an episode of rectal bleeding about a month ago that lasted for 3 days. The rectal bleeding was dark red and contained blood clots. There was a large amount of blood that spread over the floor. She was started on Xarelto for A-fib right before the rectal bleeding started. She stopped her Xarelto and the bleeding eventually stopped on its own. She stopped the meloxicam she was taking at the time as well when the bleeding first started. She has never had GI bleeding like in the past. Her last colonoscopy was >10 years ago that showed colon polyps (unclear what kind). She was told to follow up in 5 years with another colonoscopy but never followed up. Denies unintentional weight loss. Endorses some occasional chest burning and regurgitation. Denies dysphagia, N&V, diarrhea, constipation. When she has GERD flares, she will sometimes have some epigastric discomfort. She will take charcoal to help with her reflux symptoms. Her maternal aunt was diagnosed with colon cancer in her 62-60s  Wt Readings from Last 3 Encounters:  05/19/21 (!) 300 lb 2 oz (136.1 kg)  04/27/21 (!) 307 lb 1.6 oz (139.3 kg)  03/30/21 (!) 308 lb (139.7 kg)     Past Medical History:  Diagnosis Date   Arthritis    Atrial bigeminy 02/25/2021   Benign essential hypertension 05/02/2016   Formatting of this note might be different from the original. Last Assessment & Plan:  BP adequately controlled. No changes in current management. Continue low-salt diet.   Bilateral lower extremity edema 03/06/2017   Formatting of this note might be different from the original. Last Assessment & Plan:  Information about vein specialist given. Recommend compression stocking more often,HCTZ 25 mg added. Wt loss may also help.    Bilateral primary osteoarthritis of knee 05/02/2016   BMI 45.0-49.9, adult ([place]) 05/02/2016   Formatting of this note might be different from the original. Last Assessment & Plan:  She has lost some weight since starting dietary changes. Encouraged to continue a healthful diet and to engage in low impact physical activity. Walking in place is a good option while she is not able to go to the gym.   Chicken pox    Dyslipidemia 09/27/2018   Gastroesophageal reflux disease without esophagitis 09/27/2018   H/O laparoscopic adjustable gastric banding 09/27/2018   Heart murmur    Hyperlipidemia    Hypertension    IFG (impaired fasting glucose) 09/05/2016   Morbid obesity with BMI of 50.0-59.9, adult ([place]) 05/02/2016   Neuropathy, peripheral 08/09/2017   Formatting of this note might be different from the original. Last Assessment & Plan:  Problem improved with nonpharmacologic treatment. Continue following with chiropractor. Good skin care and feet check periodically.   OSA (obstructive sleep apnea) 05/02/2016   Pain in left knee 09/13/2018   Palpitations 02/25/2021     Past Surgical History:  Procedure Laterality Date   BREAST SURGERY     TONSILLECTOMY AND ADENOIDECTOMY     Family History  Problem Relation Age of Onset   Arthritis Mother    Hyperlipidemia Mother    Hypertension Mother    Stroke Mother    Arthritis Father    Hyperlipidemia Father    Hypertension Father    Stroke Father    Breast cancer Neg  Hx    Social History   Tobacco Use   Smoking status: Former    Packs/day: 0.Villanueva    Years: Villanueva.00    Pack years: 9.00    Types: Cigarettes   Smokeless tobacco: Never  Substance Use Topics   Drug use: Never   Current Outpatient Medications  Medication Sig Dispense Refill   amLODipine (NORVASC) 10 MG tablet Take 1 tablet (10 mg total) by mouth daily. 180 tablet 3   Coenzyme Q10 ([employer] COQ-10) 200 MG capsule Take 1 capsule (200 mg total) by mouth daily. 90 capsule 3   furosemide (LASIX) 20  MG tablet Take 20 mg by mouth as needed for edema or fluid.     losartan-hydrochlorothiazide (HYZAAR) 100-25 MG tablet Take 1 tablet by mouth daily. 90 tablet 3   meloxicam (MOBIC) 15 MG tablet Take 15 mg by mouth as needed for pain.     Pitavastatin Calcium 2 MG TABS Take 1 tablet (2 mg total) by mouth daily. 90 tablet 3   No current facility-administered medications for this visit.   No Known Allergies   Review of Systems: All systems reviewed and negative except where noted in HPI.   Physical Exam: BP 124/70   Pulse 94   Ht 5\' 2"  (1.575 m)   Wt (!) 300 lb 2 oz (136.1 kg)   BMI 54.89 kg/m  Constitutional: Pleasant,well-developed, female in no acute distress. HEENT: Normocephalic and atraumatic. Conjunctivae are normal. No scleral icterus. Cardiovascular: Normal rate, regular rhythm.  Pulmonary/chest: Effort normal and breath sounds normal. No wheezing, rales or rhonchi. Abdominal: Soft, nondistended, nontender. Bowel sounds active throughout. There are no masses palpable. No hepatomegaly. Extremities: Trace edema (legs are in compression stockings) Neurological: Alert and oriented Skin: Skin is warm and dry. No rashes noted. Psychiatric: Normal mood and affect. Behavior is normal.  Labs 02/2021 - CMP unremarkable  Labs 03/2021 - Hb 11.6 (low)  FOBT 04/04/21 - Positive  Upper GI series 10/18/18: 1.  Normal positioning of lap band without evidence of pouch dilatation or gastric erosion.  2.  Nonspecific esophageal dysmotility.  Cardiac event monitor  Result Date: 04/29/2021 Tina Villanueva, Tina Villanueva Tina Villanueva, Tina Villanueva, MRN [id number] EVENT MONITOR REPORT:    Patient was monitored from 03/25/2021 to 04/23/2021 Indication:                    Palpitations Ordering physician:  Jenean Lindau, MD Referring physician:  Jenean Lindau, MD Baseline rhythm: atrial fibrillation Atrial arrhythmia: atrial fibrillation Ventricular arrhythmia: none significant. Conduction abnormality: none Symptoms: none  Conclusion: Atrial fibrillation with fairly controlled ventricular rate. Interpreting  cardiologist: Jenean Lindau, MD Date: 04/29/2021 11:54 AM   TTE 02/2021: 1. Irregular rhythm - possibly atrial fibrillation. Left ventricular  ejection fraction, by estimation, is 60 to 65%. The left ventricle has  normal function. The left ventricle has no regional wall motion  abnormalities. Left ventricular diastolic  parameters are indeterminate.   2. Right ventricular systolic function is normal. The right ventricular  size is normal. There is normal pulmonary artery systolic pressure.   3. Left atrial size was mildly dilated.   4. The mitral valve is normal in structure. Trivial mitral valve  regurgitation. No evidence of mitral stenosis.   5. The aortic valve is normal in structure. Aortic valve regurgitation is  not visualized. No aortic stenosis is present.   6. The inferior vena cava is normal in size with greater than 50%  respiratory variability, suggesting right atrial pressure  of 3 mmHg.   ASSESSMENT AND PLAN:  Positive FOBT Rectal bleeding Patient had an episode of rectal bleeding after being started on Xarelto. Unclear what the cause of her bleeding was since her last colonoscopy was >10 years ago and we do not have formal records from her last colonoscopy. Could consider a diverticular bleed but would want to rule out malignancy Patient was also on meloxicam at the time so this could lead to some inflammation in her colon. We will plan to proceed with colonoscopy to determine the etiology of her rectal bleeding.  - Colonoscopy in the hospital due to BMI  Christia Reading, MD

## 2021-05-19 NOTE — Patient Instructions (Signed)
If you are age 70 or older, your body mass index should be between 23-30. Your Body mass index is 54.89 kg/m. If this is out of the aforementioned range listed, please consider follow up with your Primary Care Provider.  The [facility] providers would like to encourage you to use [facility] to communicate with providers for non-urgent requests or questions.  Due to long hold times on the telephone, sending your provider a message by [facility] may be a faster and more efficient way to get a response.  Please allow 48 business hours for a response.  Please remember that this is for non-urgent requests.   You have been scheduled for a colonoscopy. Please follow written instructions given to you at your visit today.  Please pick up your prep supplies at the pharmacy within the next 1-3 days. If you use inhalers (even only as needed), please bring them with you on the day of your procedure.  Due to recent changes in healthcare laws, you may see the results of your imaging and laboratory studies on MyChart before your provider has had a chance to review them.  We understand that in some cases there may be results that are confusing or concerning to you. Not all laboratory results come back in the same time frame and the provider may be waiting for multiple results in order to interpret others.  Please give [place] 48 hours in order for your provider to thoroughly review all the results before contacting the office for clarification of your results.   Thank you for entrusting me with your care and choosing [facility].  Dr Lorenso Courier

## 2021-05-31 ENCOUNTER — Encounter (HOSPITAL_COMMUNITY): Payer: Self-pay | Admitting: Internal Medicine

## 2021-06-09 ENCOUNTER — Ambulatory Visit (HOSPITAL_COMMUNITY)
Admission: RE | Admit: 2021-06-09 | Discharge: 2021-06-09 | Disposition: A | Discharge status: Home / Self Care | Payer: Medicare Other | Source: Ambulatory Visit | Attending: Internal Medicine | Admitting: Internal Medicine

## 2021-06-09 ENCOUNTER — Ambulatory Visit (HOSPITAL_COMMUNITY): Payer: Medicare Other | Admitting: Anesthesiology

## 2021-06-09 ENCOUNTER — Encounter (HOSPITAL_COMMUNITY): Payer: Self-pay | Admitting: Internal Medicine

## 2021-06-09 ENCOUNTER — Encounter (HOSPITAL_COMMUNITY)
Admission: RE | Disposition: A | Discharge status: Home / Self Care | Payer: Self-pay | Source: Ambulatory Visit | Attending: Internal Medicine

## 2021-06-09 ENCOUNTER — Other Ambulatory Visit: Payer: Self-pay

## 2021-06-09 DIAGNOSIS — Z79899 Other long term (current) drug therapy: Secondary | ICD-10-CM | POA: Insufficient documentation

## 2021-06-09 DIAGNOSIS — K6389 Other specified diseases of intestine: Secondary | ICD-10-CM | POA: Diagnosis not present

## 2021-06-09 DIAGNOSIS — Z8249 Family history of ischemic heart disease and other diseases of the circulatory system: Secondary | ICD-10-CM | POA: Insufficient documentation

## 2021-06-09 DIAGNOSIS — D122 Benign neoplasm of ascending colon: Secondary | ICD-10-CM | POA: Diagnosis not present

## 2021-06-09 DIAGNOSIS — I480 Paroxysmal atrial fibrillation: Secondary | ICD-10-CM | POA: Diagnosis not present

## 2021-06-09 DIAGNOSIS — Z87891 Personal history of nicotine dependence: Secondary | ICD-10-CM | POA: Diagnosis not present

## 2021-06-09 DIAGNOSIS — D123 Benign neoplasm of transverse colon: Secondary | ICD-10-CM

## 2021-06-09 DIAGNOSIS — K635 Polyp of colon: Secondary | ICD-10-CM | POA: Insufficient documentation

## 2021-06-09 DIAGNOSIS — K625 Hemorrhage of anus and rectum: Secondary | ICD-10-CM

## 2021-06-09 DIAGNOSIS — Z8349 Family history of other endocrine, nutritional and metabolic diseases: Secondary | ICD-10-CM | POA: Diagnosis not present

## 2021-06-09 DIAGNOSIS — K5730 Diverticulosis of large intestine without perforation or abscess without bleeding: Secondary | ICD-10-CM | POA: Insufficient documentation

## 2021-06-09 DIAGNOSIS — R195 Other fecal abnormalities: Secondary | ICD-10-CM

## 2021-06-09 DIAGNOSIS — Z6841 Body Mass Index (BMI) 40.0 and over, adult: Secondary | ICD-10-CM | POA: Insufficient documentation

## 2021-06-09 DIAGNOSIS — K5731 Diverticulosis of large intestine without perforation or abscess with bleeding: Secondary | ICD-10-CM | POA: Diagnosis not present

## 2021-06-09 DIAGNOSIS — D125 Benign neoplasm of sigmoid colon: Secondary | ICD-10-CM | POA: Diagnosis not present

## 2021-06-09 DIAGNOSIS — Z823 Family history of stroke: Secondary | ICD-10-CM | POA: Diagnosis not present

## 2021-06-09 HISTORY — PX: COLONOSCOPY WITH PROPOFOL: SHX5780

## 2021-06-09 HISTORY — PX: BIOPSY: SHX5522

## 2021-06-09 HISTORY — PX: POLYPECTOMY: SHX5525

## 2021-06-09 SURGERY — COLONOSCOPY WITH PROPOFOL
Anesthesia: General

## 2021-06-09 MED ORDER — PROPOFOL 500 MG/50ML IV EMUL
INTRAVENOUS | Status: DC | PRN
Start: 2021-06-09 — End: 2021-06-09
  Administered 2021-06-09: 50 mg via INTRAVENOUS

## 2021-06-09 MED ORDER — ONDANSETRON HCL 4 MG/2ML IJ SOLN
INTRAMUSCULAR | Status: DC | PRN
  Administered 2021-06-09: 4 mg via INTRAVENOUS

## 2021-06-09 MED ORDER — LACTATED RINGERS IV SOLN: INTRAVENOUS | Status: DC

## 2021-06-09 MED ORDER — PHENYLEPHRINE 40 MCG/ML (10ML) SYRINGE FOR IV PUSH (FOR BLOOD PRESSURE SUPPORT)
PREFILLED_SYRINGE | INTRAVENOUS | Status: DC | PRN
  Administered 2021-06-09 (×4): 80 ug via INTRAVENOUS

## 2021-06-09 MED ORDER — PROPOFOL 500 MG/50ML IV EMUL
INTRAVENOUS | Status: DC | PRN
  Administered 2021-06-09: 125 ug/kg/min via INTRAVENOUS

## 2021-06-09 MED ORDER — DEXAMETHASONE SODIUM PHOSPHATE 10 MG/ML IJ SOLN
INTRAMUSCULAR | Status: DC | PRN
  Administered 2021-06-09: 5 mg via INTRAVENOUS

## 2021-06-09 MED ORDER — SODIUM CHLORIDE 0.9 % IV SOLN: INTRAVENOUS | Status: DC

## 2021-06-09 MED ORDER — DEXMEDETOMIDINE (PRECEDEX) IN NS 20 MCG/5ML (4 MCG/ML) IV SYRINGE
PREFILLED_SYRINGE | INTRAVENOUS | Status: DC | PRN
  Administered 2021-06-09 (×2): 8 ug via INTRAVENOUS

## 2021-06-09 MED ORDER — PROPOFOL 1000 MG/100ML IV EMUL
INTRAVENOUS | Status: AC
  Filled 2021-06-09: qty 100

## 2021-06-09 MED ORDER — PROPOFOL 1000 MG/100ML IV EMUL
INTRAVENOUS | Status: AC
  Filled 2021-06-09: qty 300

## 2021-06-09 SURGICAL SUPPLY — 21 items

## 2021-06-09 NOTE — Op Note (Signed)
[facility] Patient Name: Tina Villanueva Procedure Date: 06/09/2021 MRN: [id number] Attending MD: Georgian Co ,  Date of Birth: [birth year] CSN: [id number] Age: 70 Admit Type: Inpatient Procedure:                Colonoscopy Indications:              Rectal bleeding, Heme positive stool Providers:                Juanetta Snow                            RN, RN, Benetta Spar, Technician Referring MD:              Medicines:                Monitored Anesthesia Care Complications:            No immediate complications. Estimated Blood Loss:     Estimated blood loss was minimal. Procedure:                Pre-Anesthesia Assessment:                           - Prior to the procedure, a History and Physical                            was performed, and patient medications and                            allergies were reviewed. The patient's tolerance of                            previous anesthesia was also reviewed. The risks                            and benefits of the procedure and the sedation                            options and risks were discussed with the patient.                            All questions were answered, and informed consent                            was obtained. Prior Anticoagulants: The patient has                            taken Xarelto (rivaroxaban), last dose was 14 days                            prior to procedure. ASA Grade Assessment: III - A                            patient with severe systemic disease. After  reviewing the risks and benefits, the patient was                            deemed in satisfactory condition to undergo the                            procedure.                           After obtaining informed consent, the colonoscope                            was passed under direct vision. Throughout the                            procedure, the  patient's blood pressure, pulse, and                            oxygen saturations were monitored continuously. The                            CF-HQ190L ([id number]) Olympus colonoscope was                            introduced through the anus and advanced to the the                            terminal ileum. The colonoscopy was technically                            difficult and complex due to multiple diverticula                            in the colon, patient body habitus, and restricted                            mobility of the colon. Successful completion of the                            procedure was aided by changing the patient to a                            supine position, followed by returning the patient                            back to lateral position once the area of                            diverticula was bypassed. Colonoscope was also                            switched to a pediatric colonoscope due to stenosis  noted in the sigmoid colon. The patient tolerated                            the procedure well. Scope In: 1:14:28 PM Scope Out: 2:24:47 PM Scope Withdrawal Time: 0 hours 36 minutes 39 seconds  Total Procedure Duration: 1 hour 10 minutes 19 seconds  Findings:      The terminal ileum appeared normal.      Five sessile polyps were found in the transverse colon, ascending colon       and cecum. The polyps were 3 to 6 mm in size. These polyps were removed       with a cold snare. Resection and retrieval were complete.      A 5 mm polyp was found in the sigmoid colon. The polyp was pedunculated.       The polyp was removed with a hot snare. Resection and retrieval were       complete.      A localized area of nodular mucosa was found in the sigmoid colon.       Biopsies were taken with a cold forceps for histology.      Multiple small and large-mouthed diverticula were found in the sigmoid       colon and descending colon. There was  some stenosis in the sigmoid colon       that was suspected to be due to her extensive diverticulosis. Impression:               - The examined portion of the ileum was normal.                           - Five 3 to 6 mm polyps in the transverse colon, in                            the ascending colon and in the cecum, removed with                            a cold snare. Resected and retrieved.                           - One 5 mm polyp in the sigmoid colon, removed with                            a hot snare. Resected and retrieved.                           - Nodular mucosa in the sigmoid colon. Biopsied.                           - Diverticulosis in the sigmoid colon and in the                            descending colon. Some stenosis associated with                            diverticulosis was noted. Moderate Sedation:      Not Applicable - Patient had  care per Anesthesia. Recommendation:           - Discharge patient to home (with escort).                           - It is suspected that your rectal bleeding was due                            to a diverticular bleed.                           - Await pathology results.                           - The findings and recommendations were discussed                            with the patient and/or patient's family. Procedure Code(s):        --- Professional ---                           [phone], Colonoscopy, flexible; with removal of                            tumor(s), polyp(s), or other lesion(s) by snare                            technique                           45380, 20, Colonoscopy, flexible; with biopsy,                            single or multiple Diagnosis Code(s):        --- Professional ---                           K63.5, Polyp of colon                           K63.89, Other specified diseases of intestine                           K62.5, Hemorrhage of anus and rectum                           K57.30, Diverticulosis  of large intestine without                            perforation or abscess without bleeding CPT copyright 2019 American Medical Association. All rights reserved. The codes documented in this report are preliminary and upon coder review may  be revised to meet current compliance requirements. Sonny Masters "Christia Reading,  06/09/2021 2:39:17 PM Number of Addenda: 0

## 2021-06-09 NOTE — Anesthesia Postprocedure Evaluation (Signed)
Anesthesia Post Note  Patient: Tina Villanueva  Procedure(s) Performed: COLONOSCOPY WITH PROPOFOL POLYPECTOMY BIOPSY     Patient location during evaluation: PACU Anesthesia Type: General Level of consciousness: awake and alert, oriented and patient cooperative Pain management: pain level controlled Vital Signs Assessment: post-procedure vital signs reviewed and stable Respiratory status: spontaneous breathing, nonlabored ventilation and respiratory function stable Cardiovascular status: blood pressure returned to baseline and stable Postop Assessment: no apparent nausea or vomiting Anesthetic complications: no   No notable events documented.  Last Vitals:  Vitals:   06/09/21 1451 06/09/21 1502  BP: 140/86 (!) 136/94  Pulse: 66 70  Resp: 15 15  Temp:    SpO2: 96% 94%    Last Pain:  Vitals:   06/09/21 1502  TempSrc:   PainSc: 0-No pain                 Pervis Hocking

## 2021-06-09 NOTE — Transfer of Care (Signed)
Immediate Anesthesia Transfer of Care Note  Patient: Tina Villanueva  Procedure(s) Performed: COLONOSCOPY WITH PROPOFOL POLYPECTOMY BIOPSY  Patient Location: PACU  Anesthesia Type:General  Level of Consciousness: sedated, patient cooperative and responds to stimulation  Airway & Oxygen Therapy: Patient Spontanous Breathing and Patient connected to face mask oxygen  Post-op Assessment: Report given to RN and Post -op Vital signs reviewed and stable  Post vital signs: Reviewed and stable  Last Vitals:  Vitals Value Taken Time  BP 132/92 06/09/21 1438  Temp    Pulse 79 06/09/21 1438  Resp 17 06/09/21 1438  SpO2 100 % 06/09/21 1438    Last Pain:  Vitals:   06/09/21 1202  TempSrc: Oral  PainSc: 0-No pain         Complications: No notable events documented.

## 2021-06-09 NOTE — Anesthesia Procedure Notes (Signed)
Procedure Name: LMA Insertion Date/Time: 06/09/2021 1:39 PM Performed by: Gean Maidens, CRNA Pre-anesthesia Checklist: Patient identified, Emergency Drugs available, Suction available, Patient being monitored and Timeout performed Patient Re-evaluated:Patient Re-evaluated prior to induction Oxygen Delivery Method: Circle system utilized Preoxygenation: Pre-oxygenation with 100% oxygen Induction Type: IV induction Ventilation: Mask ventilation without difficulty LMA: LMA inserted LMA Size: 4.0 Number of attempts: 1 Placement Confirmation: positive ETCO2 and breath sounds checked- equal and bilateral Tube secured with: Tape Dental Injury: Teeth and Oropharynx as per pre-operative assessment

## 2021-06-09 NOTE — H&P (Signed)
GASTROENTEROLOGY PROCEDURE H&P NOTE   Primary Care Physician: Isaac Bliss, Rayford Halsted, MD    Reason for Procedure:   Rectal bleeding  Plan:    Colonoscopy  Patient is appropriate for endoscopic procedure(s) in the [place] endoscopy unit  The nature of the procedure, as well as the risks, benefits, and alternatives were carefully and thoroughly reviewed with the patient. Ample time for discussion and questions allowed. The patient understood, was satisfied, and agreed to proceed.     HPI: Tina Villanueva is a 70 y.o. female who presents for colonoscopy for evaluation of rectal bleeding .  Patient was most recently seen in the Gastroenterology Clinic on 05/19/21.  No interval change in medical history since that appointment. Please refer to that note for full details regarding GI history and clinical presentation.   Past Medical History:  Diagnosis Date   Arthritis    Atrial bigeminy 02/25/2021   Benign essential hypertension 05/02/2016   Formatting of this note might be different from the original. Last Assessment & Plan:  BP adequately controlled. No changes in current management. Continue low-salt diet.   Bilateral lower extremity edema 03/06/2017   Formatting of this note might be different from the original. Last Assessment & Plan:  Information about vein specialist given. Recommend compression stocking more often,HCTZ 25 mg added. Wt loss may also help.   Bilateral primary osteoarthritis of knee 05/02/2016   BMI 45.0-49.9, adult ([place]) 05/02/2016   Formatting of this note might be different from the original. Last Assessment & Plan:  She has lost some weight since starting dietary changes. Encouraged to continue a healthful diet and to engage in low impact physical activity. Walking in place is a good option while she is not able to go to the gym.   Chicken pox    Dyslipidemia 09/27/2018   Gastroesophageal reflux disease without esophagitis 09/27/2018   H/O laparoscopic  adjustable gastric banding 09/27/2018   Heart murmur    Hyperlipidemia    Hypertension    IFG (impaired fasting glucose) 09/05/2016   Morbid obesity with BMI of 50.0-59.9, adult ([place]) 05/02/2016   Neuropathy, peripheral 08/09/2017   Formatting of this note might be different from the original. Last Assessment & Plan:  Problem improved with nonpharmacologic treatment. Continue following with chiropractor. Good skin care and feet check periodically.   OSA (obstructive sleep apnea) 05/02/2016   Pain in left knee 09/13/2018   Palpitations 02/25/2021    Past Surgical History:  Procedure Laterality Date   BREAST SURGERY     TONSILLECTOMY AND ADENOIDECTOMY      Prior to Admission medications   Medication Sig Start Date End Date Taking? Authorizing Provider  amLODipine (NORVASC) 10 MG tablet Take 10 mg by mouth daily.   Yes [provider]  Coenzyme Q10 ([employer] COQ-10) 200 MG capsule Take 1 capsule (200 mg total) by mouth daily. 03/25/21  Yes Revankar, Reita Cliche, MD  furosemide (LASIX) 20 MG tablet Take 20 mg by mouth daily as needed for edema or fluid.   Yes [provider]  losartan-hydrochlorothiazide (HYZAAR) 100-25 MG tablet Take 1 tablet by mouth daily. 01/18/21  Yes [place], Betty G, MD  OVER THE COUNTER MEDICATION Apply 1 application topically at bedtime as needed (leg pain). Magnesium calming cream   Yes [provider]  Pitavastatin Calcium 2 MG TABS Take 1 tablet (2 mg total) by mouth daily. 03/25/21  Yes Revankar, Reita Cliche, MD  meloxicam (MOBIC) 15 MG tablet Take 15 mg by  mouth daily as needed for pain.    [provider]    Current Facility-Administered Medications  Medication Dose Route Frequency Provider Last Rate Last Admin   0.9 %  sodium chloride infusion   Intravenous Continuous Sharyn Creamer, MD       lactated ringers infusion   Intravenous Continuous Sharyn Creamer, MD 10 mL/hr at 06/09/21 1221 New Bag at 06/09/21 1221    Allergies as of  05/19/2021   (No Known Allergies)    Family History  Problem Relation Age of Onset   Arthritis Mother    Hyperlipidemia Mother    Hypertension Mother    Stroke Mother    Arthritis Father    Hyperlipidemia Father    Hypertension Father    Stroke Father    Breast cancer Neg Hx     Social History   Socioeconomic History   Marital status: Single    Spouse name: Not on file   Number of children: Not on file   Years of education: Not on file   Highest education level: Not on file  Occupational History   Not on file  Tobacco Use   Smoking status: Former    Packs/day: 0.30    Years: 30.00    Pack years: 9.00    Types: Cigarettes   Smokeless tobacco: Never  Substance and Sexual Activity   Alcohol use: Not on file    Comment: social drinker; now not at all    Drug use: Never   Sexual activity: Not on file  Other Topics Concern   Not on file  Social History Narrative   Live alone;    Lives in single level home; one level    Moved here due to retirement x 1 year ago   Brand new house   Walk in tub and oversized shower    Social Determinants of Health   Financial Resource Strain: Not on file  Food Insecurity: Not on file  Transportation Needs: Not on file  Physical Activity: Not on file  Stress: Not on file  Social Connections: Not on file  Intimate Partner Violence: Not on file    Physical Exam: Vital signs in last 24 hours: BP 136/67   Pulse (!) 103   Temp 97.8 F (36.6 C) (Oral)   Resp 20   Ht 5\' 2"  (1.575 m)   Wt (!) 136.1 kg   SpO2 100%   BMI 54.89 kg/m  GEN: NAD EYE: Sclerae anicteric ENT: MMM CV: Non-tachycardic Pulm: CTA b/l GI: Soft NEURO:  Alert & Oriented   Christia Reading, MD [facility]   06/09/2021 12:37 PM

## 2021-06-09 NOTE — Anesthesia Preprocedure Evaluation (Addendum)
Anesthesia Evaluation  Patient identified by MRN, date of birth, ID band Patient awake    Reviewed: Allergy & Precautions, NPO status , Patient's Chart, lab work & pertinent test results  Airway Mallampati: III  TM Distance: >3 FB Neck ROM: Full    Dental  (+) Teeth Intact, Dental Advisory Given   Pulmonary sleep apnea (non-compliant w/ CPAP) , former smoker,  Former smoker, 9 pack year history    Pulmonary exam normal breath sounds clear to auscultation       Cardiovascular hypertension, Pt. on medications + CAD  Normal cardiovascular exam+ dysrhythmias (hx bigeminy, PAF in past)  Rhythm:Regular Rate:Normal  Echo 02/2021: 1. Irregular rhythm - possibly atrial fibrillation. Left ventricular  ejection fraction, by estimation, is 60 to 65%. The left ventricle has  normal function. The left ventricle has no regional wall motion  abnormalities. Left ventricular diastolic  parameters are indeterminate.  2. Right ventricular systolic function is normal. The right ventricular  size is normal. There is normal pulmonary artery systolic pressure.  3. Left atrial size was mildly dilated.  4. The mitral valve is normal in structure. Trivial mitral valve  regurgitation. No evidence of mitral stenosis.  5. The aortic valve is normal in structure. Aortic valve regurgitation is  not visualized. No aortic stenosis is present.  6. The inferior vena cava is normal in size with greater than 50%  respiratory variability, suggesting right atrial pressure of 3 mmHg.    Neuro/Psych  Neuromuscular disease (peripheral neuropathy ) negative psych ROS   GI/Hepatic Neg liver ROS, GERD  Controlled,FOBT, rectal bleeding  Hx lap band   Endo/Other  Morbid obesityBMI 55  Renal/GU negative Renal ROS  negative genitourinary   Musculoskeletal  (+) Arthritis , Osteoarthritis,    Abdominal (+) + obese,   Peds  Hematology negative hematology  ROS (+)   Anesthesia Other Findings   Reproductive/Obstetrics negative OB ROS                            Anesthesia Physical Anesthesia Plan  ASA: 4  Anesthesia Plan: General   Post-op Pain Management:    Induction: Intravenous  PONV Risk Score and Plan: 2 and Propofol infusion and TIVA  Airway Management Planned: Natural Airway and Simple Face Mask  Additional Equipment: None  Intra-op Plan:   Post-operative Plan:   Informed Consent: I have reviewed the patients History and Physical, chart, labs and discussed the procedure including the risks, benefits and alternatives for the proposed anesthesia with the patient or authorized representative who has indicated his/her understanding and acceptance.     Dental advisory given  Plan Discussed with: CRNA  Anesthesia Plan Comments:        Anesthesia Quick Evaluation

## 2021-06-09 NOTE — Discharge Instructions (Signed)

## 2021-06-10 ENCOUNTER — Encounter (HOSPITAL_COMMUNITY): Payer: Self-pay | Admitting: Internal Medicine

## 2021-06-10 LAB — SURGICAL PATHOLOGY

## 2021-06-11 ENCOUNTER — Encounter: Payer: Self-pay | Admitting: Internal Medicine

## 2021-06-22 ENCOUNTER — Ambulatory Visit (HOSPITAL_COMMUNITY)
Admission: RE | Admit: 2021-06-22 | Discharge: 2021-06-22 | Disposition: A | Discharge status: Home / Self Care | Payer: Medicare Other | Source: Ambulatory Visit | Attending: Cardiology | Admitting: Cardiology

## 2021-06-22 ENCOUNTER — Other Ambulatory Visit: Payer: Self-pay

## 2021-06-22 DIAGNOSIS — I77810 Thoracic aortic ectasia: Secondary | ICD-10-CM | POA: Diagnosis not present

## 2021-06-22 DIAGNOSIS — N281 Cyst of kidney, acquired: Secondary | ICD-10-CM | POA: Diagnosis not present

## 2021-06-22 DIAGNOSIS — K8020 Calculus of gallbladder without cholecystitis without obstruction: Secondary | ICD-10-CM | POA: Diagnosis not present

## 2021-06-22 MED ORDER — GADOBUTROL 1 MMOL/ML IV SOLN
10.0000 mL | Freq: Once | INTRAVENOUS | Status: AC | PRN
  Administered 2021-06-22: 10 mL via INTRAVENOUS

## 2021-06-27 ENCOUNTER — Encounter: Payer: Self-pay | Admitting: Cardiology

## 2021-06-27 ENCOUNTER — Other Ambulatory Visit: Payer: Self-pay

## 2021-06-27 ENCOUNTER — Ambulatory Visit (INDEPENDENT_AMBULATORY_CARE_PROVIDER_SITE_OTHER): Payer: Medicare Other | Admitting: Cardiology

## 2021-06-27 VITALS — BP 130/84 | HR 62 | Ht 62.0 in | Wt 298.0 lb

## 2021-06-27 DIAGNOSIS — I2510 Atherosclerotic heart disease of native coronary artery without angina pectoris: Secondary | ICD-10-CM

## 2021-06-27 DIAGNOSIS — G4733 Obstructive sleep apnea (adult) (pediatric): Secondary | ICD-10-CM

## 2021-06-27 DIAGNOSIS — K625 Hemorrhage of anus and rectum: Secondary | ICD-10-CM | POA: Diagnosis not present

## 2021-06-27 DIAGNOSIS — E782 Mixed hyperlipidemia: Secondary | ICD-10-CM

## 2021-06-27 DIAGNOSIS — R002 Palpitations: Secondary | ICD-10-CM | POA: Diagnosis not present

## 2021-06-27 DIAGNOSIS — I480 Paroxysmal atrial fibrillation: Secondary | ICD-10-CM | POA: Diagnosis not present

## 2021-06-27 DIAGNOSIS — Z6841 Body Mass Index (BMI) 40.0 and over, adult: Secondary | ICD-10-CM

## 2021-06-27 DIAGNOSIS — E785 Hyperlipidemia, unspecified: Secondary | ICD-10-CM

## 2021-06-27 NOTE — Patient Instructions (Signed)

## 2021-06-27 NOTE — Progress Notes (Signed)
Cardiology Office Note:    Date:  06/27/2021   ID:  Tina Villanueva, DOB [birth year], MRN [id number]  PCP:  Tina Villanueva, Tina Halsted, MD  Cardiologist:  Tina Lindau, MD   Referring MD: [place], Betty G, MD    ASSESSMENT:    1. Paroxysmal atrial fibrillation (HCC)   2. Mixed hyperlipidemia   3. Coronary artery disease involving native coronary artery of native heart without angina pectoris   4. Rectal bleeding   5. OSA (obstructive sleep apnea)   6. Morbid obesity with BMI of 50.0-59.9, adult (HCC)    PLAN:    In order of problems listed above:  Coronary artery disease: Secondary prevention stressed with the patient.  Importance of compliance with diet medication stressed and she vocalized understanding. Paroxysmal atrial fibrillation:I discussed with the patient atrial fibrillation, disease process. Management and therapy including rate and rhythm control, anticoagulation benefits and potential risks were discussed extensively with the patient. Patient had multiple questions which were answered to patient's satisfaction.  I told her to talk to her gastroenterologist about colonoscopy results and anticoagulation.  If she is clear for anticoagulation then I told her that she can resume it and keep a watch for GI bleeding and dark stools and such and she understands.  We will do CBC today. Hypertension: Blood pressure stable and diet was emphasized. Mixed dyslipidemia and morbid obesity: Lifestyle modification was urged diet was emphasized and she promises to do better.  Risks of obesity explained.  Diet was emphasized extensively. Patient will be seen in follow-up appointment in 6 months or earlier if the patient has any concerns    Medication Adjustments/Labs and Tests Ordered: Current medicines are reviewed at length with the patient today.  Concerns regarding medicines are outlined above.  No orders of the defined types were placed in this encounter.  No orders of the  defined types were placed in this encounter.    Chief Complaint  Patient presents with   Follow-up     History of Present Illness:    Tina Villanueva is a 70 y.o. female.  Patient has past medical history of essential hypertension, dyslipidemia, morbid obesity and paroxysmal atrial fibrillation.  She also has sleep apnea A. fib.  She denies any problems at this time and takes care of activities of daily living.  No chest pain orthopnea or PND.  She leads a sedentary lifestyle.  At the time of my evaluation, the patient is alert awake oriented and in no distress.  She has had recent GI bleeding.  For this reason anticoagulation is on hold.  Past Medical History:  Diagnosis Date   Arthritis    Atrial bigeminy 02/25/2021   Benign essential hypertension 05/02/2016   Formatting of this note might be different from the original. Last Assessment & Plan:  BP adequately controlled. No changes in current management. Continue low-salt diet.   Bilateral lower extremity edema 03/06/2017   Formatting of this note might be different from the original. Last Assessment & Plan:  Information about vein specialist given. Recommend compression stocking more often,HCTZ 25 mg added. Wt loss may also help.   Bilateral primary osteoarthritis of knee 05/02/2016   BMI 45.0-49.9, adult ([place]) 05/02/2016   Formatting of this note might be different from the original. Last Assessment & Plan:  She has lost some weight since starting dietary changes. Encouraged to continue a healthful diet and to engage in low impact physical activity. Walking in place is a good option while she is  not able to go to the gym.   Chicken pox    Coronary artery disease 03/30/2021   Dyslipidemia 09/27/2018   Gastroesophageal reflux disease without esophagitis 09/27/2018   H/O laparoscopic adjustable gastric banding 09/27/2018   Heart murmur    Hyperlipidemia    Hypertension    IFG (impaired fasting glucose) 09/05/2016   Morbid obesity with  BMI of 50.0-59.9, adult ([place]) 05/02/2016   Neuropathy, peripheral 08/09/2017   Formatting of this note might be different from the original. Last Assessment & Plan:  Problem improved with nonpharmacologic treatment. Continue following with chiropractor. Good skin care and feet check periodically.   OSA (obstructive sleep apnea) 05/02/2016   Pain in left knee 09/13/2018   Palpitations 02/25/2021   Paroxysmal atrial fibrillation ([place]) 03/30/2021   Positive fecal occult blood test    Rectal bleeding     Past Surgical History:  Procedure Laterality Date   BIOPSY  06/09/2021   Procedure: BIOPSY;  Surgeon: Sharyn Creamer, MD;  Location: [facility];  Service: Gastroenterology;;   BREAST SURGERY     COLONOSCOPY WITH PROPOFOL N/A 06/09/2021   Procedure: COLONOSCOPY WITH PROPOFOL;  Surgeon: Sharyn Creamer, MD;  Location: [facility];  Service: Gastroenterology;  Laterality: N/A;   POLYPECTOMY  06/09/2021   Procedure: POLYPECTOMY;  Surgeon: Sharyn Creamer, MD;  Location: [facility];  Service: Gastroenterology;;   TONSILLECTOMY AND ADENOIDECTOMY      Current Medications: Current Meds  Medication Sig   amLODipine (NORVASC) 10 MG tablet Take 10 mg by mouth daily.   Coenzyme Q10 ([employer] COQ-10) 200 MG capsule Take 1 capsule (200 mg total) by mouth daily.   furosemide (LASIX) 20 MG tablet Take 20 mg by mouth daily as needed for edema or fluid.   losartan-hydrochlorothiazide (HYZAAR) 100-25 MG tablet Take 1 tablet by mouth daily.   OVER THE COUNTER MEDICATION Apply 1 application topically at bedtime as needed (leg pain). Magnesium calming cream   Pitavastatin Calcium 2 MG TABS Take 1 tablet (2 mg total) by mouth daily.     Allergies:   Patient has no known allergies.   Social History   Socioeconomic History   Marital status: Single    Spouse name: Not on file   Number of children: Not on file   Years of education: Not on file   Highest education level: Not on file  Occupational History    Not on file  Tobacco Use   Smoking status: Former    Packs/day: 0.30    Years: 30.00    Pack years: 9.00    Types: Cigarettes   Smokeless tobacco: Never  Substance and Sexual Activity   Alcohol use: Not on file    Comment: social drinker; now not at all    Drug use: Never   Sexual activity: Not on file  Other Topics Concern   Not on file  Social History Narrative   Live alone;    Lives in single level home; one level    Moved here due to retirement x 1 year ago   Brand new house   Walk in tub and oversized shower    Social Determinants of Health   Financial Resource Strain: Not on file  Food Insecurity: Not on file  Transportation Needs: Not on file  Physical Activity: Not on file  Stress: Not on file  Social Connections: Not on file     Family History: The patient's family history includes Arthritis in her father and mother; Hyperlipidemia in  her father and mother; Hypertension in her father and mother; Stroke in her father and mother. There is no history of Breast cancer.  ROS:   Please see the history of present illness.    All other systems reviewed and are negative.  EKGs/Labs/Other Studies Reviewed:    The following studies were reviewed today: I discussed my findings with the patient at length.   Recent Labs: 01/19/2021: TSH 2.49 03/25/2021: ALT 19; BUN 14; Creatinine, Ser 0.84; Potassium 4.4; Sodium 142 04/27/2021: Hemoglobin 11.6; Platelets 271.0  Recent Lipid Panel    Component Value Date/Time   CHOL 242 (H) 01/19/2021 1058   TRIG 138.0 01/19/2021 1058   HDL 60.50 01/19/2021 1058   CHOLHDL 4 01/19/2021 1058   VLDL 27.6 01/19/2021 1058   LDLCALC 154 (H) 01/19/2021 1058    Physical Exam:    VS:  BP 130/84 (BP Location: Left Arm, Patient Position: Sitting, Cuff Size: Large)   Pulse 62   Ht 5\' 2"  (1.575 m)   Wt 298 lb (135.2 kg)   SpO2 95%   BMI 54.50 kg/m     Wt Readings from Last 3 Encounters:  06/27/21 298 lb (135.2 kg)  06/09/21 (!) 300  lb 2 oz (136.1 kg)  05/19/21 (!) 300 lb 2 oz (136.1 kg)     GEN: Patient is in no acute distress HEENT: Normal NECK: No JVD; No carotid bruits LYMPHATICS: No lymphadenopathy CARDIAC: Hear sounds regular, 2/6 systolic murmur at the apex. RESPIRATORY:  Clear to auscultation without rales, wheezing or rhonchi  ABDOMEN: Soft, non-tender, non-distended MUSCULOSKELETAL:  No edema; No deformity  SKIN: Warm and dry NEUROLOGIC:  Alert and oriented x 3 PSYCHIATRIC:  Normal affect   Signed, Tina Lindau, MD  06/27/2021 10:34 AM    [place]

## 2021-06-28 LAB — CBC WITH DIFFERENTIAL/PLATELET
Basophils Absolute: 0 10*3/uL (ref 0.0–0.2)
Basos: 0 %
EOS (ABSOLUTE): 0.2 10*3/uL (ref 0.0–0.4)
Eos: 2 %
Hematocrit: 39.4 % (ref 34.0–46.6)
Hemoglobin: 12.5 g/dL (ref 11.1–15.9)
Immature Grans (Abs): 0 10*3/uL (ref 0.0–0.1)
Immature Granulocytes: 0 %
Lymphocytes Absolute: 1.6 10*3/uL (ref 0.7–3.1)
Lymphs: 22 %
MCH: 28.7 pg (ref 26.6–33.0)
MCHC: 31.7 g/dL (ref 31.5–35.7)
MCV: 90 fL (ref 79–97)
Monocytes Absolute: 0.4 10*3/uL (ref 0.1–0.9)
Monocytes: 6 %
Neutrophils Absolute: 4.8 10*3/uL (ref 1.4–7.0)
Neutrophils: 70 %
Platelets: 283 10*3/uL (ref 150–450)
RBC: 4.36 x10E6/uL (ref 3.77–5.28)
RDW: 12.5 % (ref 11.7–15.4)
WBC: 7 10*3/uL (ref 3.4–10.8)

## 2021-06-28 LAB — HEPATIC FUNCTION PANEL
ALT: 14 IU/L (ref 0–32)
AST: 12 IU/L (ref 0–40)
Albumin: 4.4 g/dL (ref 3.8–4.8)
Alkaline Phosphatase: 120 IU/L (ref 44–121)
Bilirubin Total: 0.8 mg/dL (ref 0.0–1.2)
Bilirubin, Direct: 0.21 mg/dL (ref 0.00–0.40)
Total Protein: 6.6 g/dL (ref 6.0–8.5)

## 2021-06-28 LAB — LIPID PANEL
Chol/HDL Ratio: 3.4 ratio (ref 0.0–4.4)
Cholesterol, Total: 203 mg/dL — ABNORMAL HIGH (ref 100–199)
HDL: 60 mg/dL (ref >39)
LDL Chol Calc (NIH): 130 mg/dL — ABNORMAL HIGH (ref 0–99)
Triglycerides: 73 mg/dL (ref 0–149)
VLDL Cholesterol Cal: 13 mg/dL (ref 5–40)

## 2021-06-28 LAB — BASIC METABOLIC PANEL
BUN/Creatinine Ratio: 16 (ref 12–28)
BUN: 13 mg/dL (ref 8–27)
CO2: 27 mmol/L (ref 20–29)
Calcium: 9.2 mg/dL (ref 8.7–10.3)
Chloride: 100 mmol/L (ref 96–106)
Creatinine, Ser: 0.8 mg/dL (ref 0.57–1.00)
Glucose: 117 mg/dL — ABNORMAL HIGH (ref 70–99)
Potassium: 4 mmol/L (ref 3.5–5.2)
Sodium: 140 mmol/L (ref 134–144)
eGFR: 79 mL/min/{1.73_m2} (ref >59)

## 2021-06-28 LAB — TSH: TSH: 2.7 u[IU]/mL (ref 0.450–4.500)

## 2021-06-28 MED ORDER — PITAVASTATIN CALCIUM 4 MG PO TABS: 4.0000 mg | ORAL_TABLET | Freq: Every day | ORAL | 3 refills | Status: DC

## 2021-06-28 NOTE — Addendum Note (Signed)
Addended by: Truddie Hidden on: 06/28/2021 04:58 PM   Modules accepted: Orders

## 2021-06-30 ENCOUNTER — Telehealth: Payer: Self-pay | Admitting: Internal Medicine

## 2021-06-30 ENCOUNTER — Ambulatory Visit: Payer: Medicare Other | Admitting: Cardiology

## 2021-06-30 NOTE — Telephone Encounter (Signed)
Patient called to ask when she can start taking her Xarelto medication again since she already had her procedure.

## 2021-06-30 NOTE — Telephone Encounter (Signed)
Patient had colonoscopy on 06-09-21 at [facility]. It seems as if she did not restart her Tina Villanueva after the procedure. She saw her cardiologist Dr Geraldo Pitter on 06-27-21.  Per Dr Julien Nordmann office note from 06-27-21, " I told her to talk to her gastroenterologist about colonoscopy results and anticoagulation.  If she is clear for anticoagulation then I told her that she can resume it and keep a watch for GI bleeding and dark stools and such and she understands."  Please advise whether or not patient should restart Xarelto at this time.

## 2021-06-30 NOTE — Telephone Encounter (Signed)
From cardiologist OV 10/31:  Paroxysmal atrial fibrillation:I discussed with the patient atrial fibrillation, disease process. Management and therapy including rate and rhythm control, anticoagulation benefits and potential risks were discussed extensively with the patient. Patient had multiple questions which were answered to patient's satisfaction.  I told her to talk to her gastroenterologist about colonoscopy results and anticoagulation.  If she is clear for anticoagulation then I told her that she can resume it and keep a watch for GI bleeding and dark stools and such and she understands.  We will do CBC today.  Reviewed procedure note, path letter, discharge summary and AVS. Did not see any mention about resuming Xarelto. Routing to Dr. Lorenso Courier for further advice.

## 2021-07-01 NOTE — Telephone Encounter (Signed)
Pt notified that Dr. Lorenso Courier states for her to resume her Xarelto.  Pt questioned results from her test. Pt was made aware of her results from the letter that was created by Dr. Lorenso Courier  Pt verbalized understanding with all questions answered

## 2021-07-19 ENCOUNTER — Telehealth: Payer: Self-pay | Admitting: *Deleted

## 2021-07-19 NOTE — Telephone Encounter (Signed)
-----   Message from Delena Serve sent at 07/13/2021  1:22 PM EST ----- Regarding: Sleep Studies Do you by chance schedule sleep study appointments?

## 2021-07-19 NOTE — Telephone Encounter (Signed)
Per Margreta Journey this referral has been closed.

## 2021-08-16 ENCOUNTER — Ambulatory Visit (INDEPENDENT_AMBULATORY_CARE_PROVIDER_SITE_OTHER): Payer: Medicare Other | Admitting: Internal Medicine

## 2021-08-16 ENCOUNTER — Encounter: Payer: Self-pay | Admitting: Internal Medicine

## 2021-08-16 VITALS — BP 130/90 | HR 57 | Temp 98.0°F | Wt 304.1 lb

## 2021-08-16 DIAGNOSIS — E782 Mixed hyperlipidemia: Secondary | ICD-10-CM | POA: Diagnosis not present

## 2021-08-16 DIAGNOSIS — I2510 Atherosclerotic heart disease of native coronary artery without angina pectoris: Secondary | ICD-10-CM | POA: Diagnosis not present

## 2021-08-16 DIAGNOSIS — K625 Hemorrhage of anus and rectum: Secondary | ICD-10-CM | POA: Diagnosis not present

## 2021-08-16 DIAGNOSIS — I480 Paroxysmal atrial fibrillation: Secondary | ICD-10-CM | POA: Diagnosis not present

## 2021-08-16 LAB — CBC WITH DIFFERENTIAL/PLATELET
Basophils Absolute: 0 10*3/uL (ref 0.0–0.1)
Basophils Relative: 0.4 % (ref 0.0–3.0)
Eosinophils Absolute: 0.2 10*3/uL (ref 0.0–0.7)
Eosinophils Relative: 2.6 % (ref 0.0–5.0)
HCT: 37.1 % (ref 36.0–46.0)
Hemoglobin: 12.1 g/dL (ref 12.0–15.0)
Lymphocytes Relative: 25 % (ref 12.0–46.0)
Lymphs Abs: 1.6 10*3/uL (ref 0.7–4.0)
MCHC: 32.6 g/dL (ref 30.0–36.0)
MCV: 89.1 fl (ref 78.0–100.0)
Monocytes Absolute: 0.4 10*3/uL (ref 0.1–1.0)
Monocytes Relative: 6.2 % (ref 3.0–12.0)
Neutro Abs: 4.4 10*3/uL (ref 1.4–7.7)
Neutrophils Relative %: 65.8 % (ref 43.0–77.0)
Platelets: 268 10*3/uL (ref 150.0–400.0)
RBC: 4.16 Mil/uL (ref 3.87–5.11)
RDW: 14.7 % (ref 11.5–15.5)
WBC: 6.6 10*3/uL (ref 4.0–10.5)

## 2021-08-16 MED ORDER — RIVAROXABAN 20 MG PO TABS: 20.0000 mg | ORAL_TABLET | Freq: Every day | ORAL | Status: DC

## 2021-08-16 MED ORDER — ATORVASTATIN CALCIUM 40 MG PO TABS: 40.0000 mg | ORAL_TABLET | Freq: Every day | ORAL | 1 refills | Status: DC

## 2021-08-16 NOTE — Progress Notes (Signed)
Established Patient Office Visit     This visit occurred during the SARS-CoV-2 public health emergency.  Safety protocols were in place, including screening questions prior to the visit, additional usage of staff PPE, and extensive cleaning of exam room while observing appropriate contact time as indicated for disinfecting solutions.    CC/Reason for Visit: Discuss change in cholesterol medicine and rectal bleeding  HPI: Tina Villanueva is a 70 y.o. female who is coming in today for the above mentioned reasons.  She had an episode of rectal bleeding over the summer approximately 6 weeks after starting Xarelto for atrial fibrillation.  She had a colonoscopy that showed several polyps and diverticulosis.  Presumed etiology of the bleed was diverticular.  It resolved after about 3 days.  She resumed her Xarelto 6 weeks ago.  She states that on Sunday, 2 days ago she again had a large bloody bowel movement, quit taking her Xarelto that day and has not had blood in her stool since.  She was prescribed pitavstatin by her cardiologist, however she has a large co-pay with this medication and would like to switch to atorvastatin or rosuvastatin which would be free of cost to her.  Past Medical/Surgical History: Past Medical History:  Diagnosis Date   Arthritis    Atrial bigeminy 02/25/2021   Benign essential hypertension 05/02/2016   Formatting of this note might be different from the original. Last Assessment & Plan:  BP adequately controlled. No changes in current management. Continue low-salt diet.   Bilateral lower extremity edema 03/06/2017   Formatting of this note might be different from the original. Last Assessment & Plan:  Information about vein specialist given. Recommend compression stocking more often,HCTZ 25 mg added. Wt loss may also help.   Bilateral primary osteoarthritis of knee 05/02/2016   BMI 45.0-49.9, adult ([place]) 05/02/2016   Formatting of this note might be different from  the original. Last Assessment & Plan:  She has lost some weight since starting dietary changes. Encouraged to continue a healthful diet and to engage in low impact physical activity. Walking in place is a good option while she is not able to go to the gym.   Chicken pox    Coronary artery disease 03/30/2021   Dyslipidemia 09/27/2018   Gastroesophageal reflux disease without esophagitis 09/27/2018   H/O laparoscopic adjustable gastric banding 09/27/2018   Heart murmur    Hyperlipidemia    Hypertension    IFG (impaired fasting glucose) 09/05/2016   Morbid obesity with BMI of 50.0-59.9, adult ([place]) 05/02/2016   Neuropathy, peripheral 08/09/2017   Formatting of this note might be different from the original. Last Assessment & Plan:  Problem improved with nonpharmacologic treatment. Continue following with chiropractor. Good skin care and feet check periodically.   OSA (obstructive sleep apnea) 05/02/2016   Pain in left knee 09/13/2018   Palpitations 02/25/2021   Paroxysmal atrial fibrillation ([place]) 03/30/2021   Positive fecal occult blood test    Rectal bleeding     Past Surgical History:  Procedure Laterality Date   BIOPSY  06/09/2021   Procedure: BIOPSY;  Surgeon: Sharyn Creamer, MD;  Location: [facility];  Service: Gastroenterology;;   BREAST SURGERY     COLONOSCOPY WITH PROPOFOL N/A 06/09/2021   Procedure: COLONOSCOPY WITH PROPOFOL;  Surgeon: Sharyn Creamer, MD;  Location: [facility];  Service: Gastroenterology;  Laterality: N/A;   POLYPECTOMY  06/09/2021   Procedure: POLYPECTOMY;  Surgeon: Sharyn Creamer, MD;  Location: [facility];  Service: Gastroenterology;;   TONSILLECTOMY AND ADENOIDECTOMY      Social History:  reports that she has quit smoking. Her smoking use included cigarettes. She has a 9.00 pack-year smoking history. She has never used smokeless tobacco. She reports that she does not use drugs. No history on file for alcohol use.  Allergies: No Known  Allergies  Family History:  Family History  Problem Relation Age of Onset   Arthritis Mother    Hyperlipidemia Mother    Hypertension Mother    Stroke Mother    Arthritis Father    Hyperlipidemia Father    Hypertension Father    Stroke Father    Breast cancer Neg Hx      Current Outpatient Medications:    amLODipine (NORVASC) 10 MG tablet, Take 10 mg by mouth daily., Disp: , Rfl:    atorvastatin (LIPITOR) 40 MG tablet, Take 1 tablet (40 mg total) by mouth daily., Disp: 90 tablet, Rfl: 1   Coenzyme Q10 ([employer] COQ-10) 200 MG capsule, Take 1 capsule (200 mg total) by mouth daily., Disp: 90 capsule, Rfl: 3   furosemide (LASIX) 20 MG tablet, Take 20 mg by mouth daily as needed for edema or fluid., Disp: , Rfl:    losartan-hydrochlorothiazide (HYZAAR) 100-25 MG tablet, Take 1 tablet by mouth daily., Disp: 90 tablet, Rfl: 3   OVER THE COUNTER MEDICATION, Apply 1 application topically at bedtime as needed (leg pain). Magnesium calming cream, Disp: , Rfl:    Pitavastatin Calcium 4 MG TABS, Take 1 tablet (4 mg total) by mouth daily., Disp: 90 tablet, Rfl: 3   rivaroxaban (XARELTO) 20 MG TABS tablet, Take 1 tablet (20 mg total) by mouth daily with supper., Disp: 30 tablet, Rfl:   Review of Systems:  Constitutional: Denies fever, chills, diaphoresis, appetite change and fatigue.  HEENT: Denies photophobia, eye pain, redness, hearing loss, ear pain, congestion, sore throat, rhinorrhea, sneezing, mouth sores, trouble swallowing, neck pain, neck stiffness and tinnitus.   Respiratory: Denies SOB, DOE, cough, chest tightness,  and wheezing.   Cardiovascular: Denies chest pain, palpitations and leg swelling.  Gastrointestinal: Denies nausea, vomiting, abdominal pain, diarrhea, constipation,  and abdominal distention.  Genitourinary: Denies dysuria, urgency, frequency, hematuria, flank pain and difficulty urinating.  Endocrine: Denies: hot or cold intolerance, sweats, changes in hair or nails, polyuria,  polydipsia. Musculoskeletal: Denies myalgias, back pain, joint swelling, arthralgias and gait problem.  Skin: Denies pallor, rash and wound.  Neurological: Denies dizziness, seizures, syncope, weakness, light-headedness, numbness and headaches.  Hematological: Denies adenopathy. Easy bruising, personal or family bleeding history  Psychiatric/Behavioral: Denies suicidal ideation, mood changes, confusion, nervousness, sleep disturbance and agitation    Physical Exam: Vitals:   08/16/21 1135  BP: 130/90  Pulse: (!) 57  Temp: 98 F (36.7 C)  TempSrc: Oral  SpO2: 100%  Weight: (!) 304 lb 1.6 oz (137.9 kg)    Body mass index is 55.62 kg/m.   Constitutional: NAD, calm, comfortable Eyes: PERRL, lids and conjunctivae normal ENMT: Mucous membranes are moist.  Respiratory: clear to auscultation bilaterally, no wheezing, no crackles. Normal respiratory effort. No accessory muscle use.  Cardiovascular: Regular rate and rhythm, no murmurs / rubs / gallops. No extremity edema.  Psychiatric: Normal judgment and insight. Alert and oriented x 3. Normal mood.    Impression and Plan:  Paroxysmal atrial fibrillation (HCC)  Rectal bleeding  - Plan: CBC with Differential/Platelet -Check hemoglobin today, bleeding has already tapered off.  Bleeding is presumed to be diverticular based on colonoscopy  report from October.  Since it is self resolved I think it is probably okay for her to resume her Xarelto.  She will notify us if she has recurrence.  Have asked her to notify GI and cardiology as well.  Mixed hyperlipidemia  - Plan: atorvastatin (LIPITOR) 40 MG tablet -Changed to atorvastatin from pitavastatin per patient's request due to affordability issues, should have repeat lipids in 3 months.  Time spent: 32 minutes reviewing chart, interviewing and examining patient and formulating plan of care.   Patient Instructions  -Nice seeing you today!!  -Lab work today; will notify you once  results are available.  -Start atorvastatin 40 mg daily. Stop pitavastatin.  -Resume Xarelto as long as no further bleeding. Notify us if bleeding resumes.    Lelon Frohlich, MD [facility]

## 2021-08-16 NOTE — Patient Instructions (Signed)
-  Nice seeing you today!!  -Lab work today; will notify you once results are available.  -Start atorvastatin 40 mg daily. Stop pitavastatin.  -Resume Xarelto as long as no further bleeding. Notify us if bleeding resumes.

## 2021-09-27 ENCOUNTER — Other Ambulatory Visit: Payer: Self-pay | Admitting: Cardiology

## 2021-09-27 DIAGNOSIS — I480 Paroxysmal atrial fibrillation: Secondary | ICD-10-CM

## 2021-09-27 NOTE — Telephone Encounter (Signed)
Prescription refill request for Xarelto received.  Indication: afib  Last office visit: Revankar 06/27/2021 Weight:137.9 kg  Age: 71 yo  Scr: 0.8, 06/27/2021 CrCl: 142 ml/min   Refill sent.

## 2021-09-28 DIAGNOSIS — H25813 Combined forms of age-related cataract, bilateral: Secondary | ICD-10-CM | POA: Diagnosis not present

## 2021-09-28 DIAGNOSIS — H40013 Open angle with borderline findings, low risk, bilateral: Secondary | ICD-10-CM | POA: Diagnosis not present

## 2021-10-11 ENCOUNTER — Other Ambulatory Visit: Payer: Self-pay | Admitting: *Deleted

## 2021-10-11 DIAGNOSIS — I10 Essential (primary) hypertension: Secondary | ICD-10-CM

## 2021-10-11 DIAGNOSIS — E782 Mixed hyperlipidemia: Secondary | ICD-10-CM

## 2021-10-11 MED ORDER — LOSARTAN POTASSIUM-HCTZ 100-25 MG PO TABS: 1.0000 | ORAL_TABLET | Freq: Every day | ORAL | 1 refills | Status: DC

## 2021-10-11 MED ORDER — AMLODIPINE BESYLATE 10 MG PO TABS: 10.0000 mg | ORAL_TABLET | Freq: Every day | ORAL | 1 refills | Status: DC

## 2021-10-11 MED ORDER — ATORVASTATIN CALCIUM 40 MG PO TABS: 40.0000 mg | ORAL_TABLET | Freq: Every day | ORAL | 1 refills | Status: DC

## 2021-10-19 DIAGNOSIS — M9903 Segmental and somatic dysfunction of lumbar region: Secondary | ICD-10-CM | POA: Diagnosis not present

## 2021-10-19 DIAGNOSIS — M5137 Other intervertebral disc degeneration, lumbosacral region: Secondary | ICD-10-CM | POA: Diagnosis not present

## 2021-10-19 DIAGNOSIS — M5416 Radiculopathy, lumbar region: Secondary | ICD-10-CM | POA: Diagnosis not present

## 2021-10-19 DIAGNOSIS — M9904 Segmental and somatic dysfunction of sacral region: Secondary | ICD-10-CM | POA: Diagnosis not present

## 2021-10-19 DIAGNOSIS — M9902 Segmental and somatic dysfunction of thoracic region: Secondary | ICD-10-CM | POA: Diagnosis not present

## 2021-10-19 DIAGNOSIS — M5135 Other intervertebral disc degeneration, thoracolumbar region: Secondary | ICD-10-CM | POA: Diagnosis not present

## 2021-10-21 DIAGNOSIS — M5137 Other intervertebral disc degeneration, lumbosacral region: Secondary | ICD-10-CM | POA: Diagnosis not present

## 2021-10-21 DIAGNOSIS — M5135 Other intervertebral disc degeneration, thoracolumbar region: Secondary | ICD-10-CM | POA: Diagnosis not present

## 2021-10-21 DIAGNOSIS — M9902 Segmental and somatic dysfunction of thoracic region: Secondary | ICD-10-CM | POA: Diagnosis not present

## 2021-10-21 DIAGNOSIS — M9904 Segmental and somatic dysfunction of sacral region: Secondary | ICD-10-CM | POA: Diagnosis not present

## 2021-10-21 DIAGNOSIS — M9903 Segmental and somatic dysfunction of lumbar region: Secondary | ICD-10-CM | POA: Diagnosis not present

## 2021-10-21 DIAGNOSIS — M5416 Radiculopathy, lumbar region: Secondary | ICD-10-CM | POA: Diagnosis not present

## 2021-10-25 DIAGNOSIS — M5416 Radiculopathy, lumbar region: Secondary | ICD-10-CM | POA: Diagnosis not present

## 2021-10-25 DIAGNOSIS — M9904 Segmental and somatic dysfunction of sacral region: Secondary | ICD-10-CM | POA: Diagnosis not present

## 2021-10-25 DIAGNOSIS — M5137 Other intervertebral disc degeneration, lumbosacral region: Secondary | ICD-10-CM | POA: Diagnosis not present

## 2021-10-25 DIAGNOSIS — M9903 Segmental and somatic dysfunction of lumbar region: Secondary | ICD-10-CM | POA: Diagnosis not present

## 2021-10-25 DIAGNOSIS — M5135 Other intervertebral disc degeneration, thoracolumbar region: Secondary | ICD-10-CM | POA: Diagnosis not present

## 2021-10-25 DIAGNOSIS — M9902 Segmental and somatic dysfunction of thoracic region: Secondary | ICD-10-CM | POA: Diagnosis not present

## 2021-10-27 DIAGNOSIS — M5416 Radiculopathy, lumbar region: Secondary | ICD-10-CM | POA: Diagnosis not present

## 2021-10-27 DIAGNOSIS — M9902 Segmental and somatic dysfunction of thoracic region: Secondary | ICD-10-CM | POA: Diagnosis not present

## 2021-10-27 DIAGNOSIS — M9904 Segmental and somatic dysfunction of sacral region: Secondary | ICD-10-CM | POA: Diagnosis not present

## 2021-10-27 DIAGNOSIS — M5137 Other intervertebral disc degeneration, lumbosacral region: Secondary | ICD-10-CM | POA: Diagnosis not present

## 2021-10-27 DIAGNOSIS — M5135 Other intervertebral disc degeneration, thoracolumbar region: Secondary | ICD-10-CM | POA: Diagnosis not present

## 2021-10-27 DIAGNOSIS — M9903 Segmental and somatic dysfunction of lumbar region: Secondary | ICD-10-CM | POA: Diagnosis not present

## 2021-10-28 DIAGNOSIS — M5416 Radiculopathy, lumbar region: Secondary | ICD-10-CM | POA: Diagnosis not present

## 2021-10-28 DIAGNOSIS — M9902 Segmental and somatic dysfunction of thoracic region: Secondary | ICD-10-CM | POA: Diagnosis not present

## 2021-10-28 DIAGNOSIS — M9903 Segmental and somatic dysfunction of lumbar region: Secondary | ICD-10-CM | POA: Diagnosis not present

## 2021-10-28 DIAGNOSIS — M5137 Other intervertebral disc degeneration, lumbosacral region: Secondary | ICD-10-CM | POA: Diagnosis not present

## 2021-10-28 DIAGNOSIS — M5135 Other intervertebral disc degeneration, thoracolumbar region: Secondary | ICD-10-CM | POA: Diagnosis not present

## 2021-10-28 DIAGNOSIS — M9904 Segmental and somatic dysfunction of sacral region: Secondary | ICD-10-CM | POA: Diagnosis not present

## 2021-11-01 DIAGNOSIS — M5137 Other intervertebral disc degeneration, lumbosacral region: Secondary | ICD-10-CM | POA: Diagnosis not present

## 2021-11-01 DIAGNOSIS — M9904 Segmental and somatic dysfunction of sacral region: Secondary | ICD-10-CM | POA: Diagnosis not present

## 2021-11-01 DIAGNOSIS — M5135 Other intervertebral disc degeneration, thoracolumbar region: Secondary | ICD-10-CM | POA: Diagnosis not present

## 2021-11-01 DIAGNOSIS — M5416 Radiculopathy, lumbar region: Secondary | ICD-10-CM | POA: Diagnosis not present

## 2021-11-01 DIAGNOSIS — M9902 Segmental and somatic dysfunction of thoracic region: Secondary | ICD-10-CM | POA: Diagnosis not present

## 2021-11-01 DIAGNOSIS — M9903 Segmental and somatic dysfunction of lumbar region: Secondary | ICD-10-CM | POA: Diagnosis not present

## 2021-11-02 DIAGNOSIS — M5416 Radiculopathy, lumbar region: Secondary | ICD-10-CM | POA: Diagnosis not present

## 2021-11-02 DIAGNOSIS — M9904 Segmental and somatic dysfunction of sacral region: Secondary | ICD-10-CM | POA: Diagnosis not present

## 2021-11-02 DIAGNOSIS — M9903 Segmental and somatic dysfunction of lumbar region: Secondary | ICD-10-CM | POA: Diagnosis not present

## 2021-11-02 DIAGNOSIS — M5135 Other intervertebral disc degeneration, thoracolumbar region: Secondary | ICD-10-CM | POA: Diagnosis not present

## 2021-11-02 DIAGNOSIS — M9902 Segmental and somatic dysfunction of thoracic region: Secondary | ICD-10-CM | POA: Diagnosis not present

## 2021-11-02 DIAGNOSIS — M5137 Other intervertebral disc degeneration, lumbosacral region: Secondary | ICD-10-CM | POA: Diagnosis not present

## 2021-11-10 DIAGNOSIS — M9902 Segmental and somatic dysfunction of thoracic region: Secondary | ICD-10-CM | POA: Diagnosis not present

## 2021-11-10 DIAGNOSIS — M5416 Radiculopathy, lumbar region: Secondary | ICD-10-CM | POA: Diagnosis not present

## 2021-11-10 DIAGNOSIS — M9904 Segmental and somatic dysfunction of sacral region: Secondary | ICD-10-CM | POA: Diagnosis not present

## 2021-11-10 DIAGNOSIS — M5137 Other intervertebral disc degeneration, lumbosacral region: Secondary | ICD-10-CM | POA: Diagnosis not present

## 2021-11-10 DIAGNOSIS — M5135 Other intervertebral disc degeneration, thoracolumbar region: Secondary | ICD-10-CM | POA: Diagnosis not present

## 2021-11-10 DIAGNOSIS — M9903 Segmental and somatic dysfunction of lumbar region: Secondary | ICD-10-CM | POA: Diagnosis not present

## 2021-11-15 DIAGNOSIS — M5416 Radiculopathy, lumbar region: Secondary | ICD-10-CM | POA: Diagnosis not present

## 2021-11-15 DIAGNOSIS — M9903 Segmental and somatic dysfunction of lumbar region: Secondary | ICD-10-CM | POA: Diagnosis not present

## 2021-11-15 DIAGNOSIS — M5137 Other intervertebral disc degeneration, lumbosacral region: Secondary | ICD-10-CM | POA: Diagnosis not present

## 2021-11-15 DIAGNOSIS — M5135 Other intervertebral disc degeneration, thoracolumbar region: Secondary | ICD-10-CM | POA: Diagnosis not present

## 2021-11-15 DIAGNOSIS — M9902 Segmental and somatic dysfunction of thoracic region: Secondary | ICD-10-CM | POA: Diagnosis not present

## 2021-11-15 DIAGNOSIS — M9904 Segmental and somatic dysfunction of sacral region: Secondary | ICD-10-CM | POA: Diagnosis not present

## 2021-11-17 DIAGNOSIS — M9902 Segmental and somatic dysfunction of thoracic region: Secondary | ICD-10-CM | POA: Diagnosis not present

## 2021-11-17 DIAGNOSIS — M9903 Segmental and somatic dysfunction of lumbar region: Secondary | ICD-10-CM | POA: Diagnosis not present

## 2021-11-17 DIAGNOSIS — M5135 Other intervertebral disc degeneration, thoracolumbar region: Secondary | ICD-10-CM | POA: Diagnosis not present

## 2021-11-17 DIAGNOSIS — M9904 Segmental and somatic dysfunction of sacral region: Secondary | ICD-10-CM | POA: Diagnosis not present

## 2021-11-17 DIAGNOSIS — M5416 Radiculopathy, lumbar region: Secondary | ICD-10-CM | POA: Diagnosis not present

## 2021-11-17 DIAGNOSIS — M5137 Other intervertebral disc degeneration, lumbosacral region: Secondary | ICD-10-CM | POA: Diagnosis not present

## 2021-11-22 DIAGNOSIS — M9904 Segmental and somatic dysfunction of sacral region: Secondary | ICD-10-CM | POA: Diagnosis not present

## 2021-11-22 DIAGNOSIS — M9902 Segmental and somatic dysfunction of thoracic region: Secondary | ICD-10-CM | POA: Diagnosis not present

## 2021-11-22 DIAGNOSIS — M5137 Other intervertebral disc degeneration, lumbosacral region: Secondary | ICD-10-CM | POA: Diagnosis not present

## 2021-11-22 DIAGNOSIS — M5135 Other intervertebral disc degeneration, thoracolumbar region: Secondary | ICD-10-CM | POA: Diagnosis not present

## 2021-11-22 DIAGNOSIS — M9903 Segmental and somatic dysfunction of lumbar region: Secondary | ICD-10-CM | POA: Diagnosis not present

## 2021-11-22 DIAGNOSIS — M5416 Radiculopathy, lumbar region: Secondary | ICD-10-CM | POA: Diagnosis not present

## 2021-11-24 DIAGNOSIS — M5416 Radiculopathy, lumbar region: Secondary | ICD-10-CM | POA: Diagnosis not present

## 2021-11-24 DIAGNOSIS — M9902 Segmental and somatic dysfunction of thoracic region: Secondary | ICD-10-CM | POA: Diagnosis not present

## 2021-11-24 DIAGNOSIS — M5135 Other intervertebral disc degeneration, thoracolumbar region: Secondary | ICD-10-CM | POA: Diagnosis not present

## 2021-11-24 DIAGNOSIS — M5137 Other intervertebral disc degeneration, lumbosacral region: Secondary | ICD-10-CM | POA: Diagnosis not present

## 2021-11-24 DIAGNOSIS — M9904 Segmental and somatic dysfunction of sacral region: Secondary | ICD-10-CM | POA: Diagnosis not present

## 2021-11-24 DIAGNOSIS — M9903 Segmental and somatic dysfunction of lumbar region: Secondary | ICD-10-CM | POA: Diagnosis not present

## 2021-12-22 ENCOUNTER — Ambulatory Visit (INDEPENDENT_AMBULATORY_CARE_PROVIDER_SITE_OTHER): Payer: Medicare Other | Admitting: Cardiology

## 2021-12-22 ENCOUNTER — Encounter: Payer: Self-pay | Admitting: Cardiology

## 2021-12-22 VITALS — BP 130/74 | HR 76 | Ht 62.0 in | Wt 290.1 lb

## 2021-12-22 DIAGNOSIS — I480 Paroxysmal atrial fibrillation: Secondary | ICD-10-CM | POA: Diagnosis not present

## 2021-12-22 DIAGNOSIS — E782 Mixed hyperlipidemia: Secondary | ICD-10-CM | POA: Diagnosis not present

## 2021-12-22 MED ORDER — RIVAROXABAN 20 MG PO TABS: 20.0000 mg | ORAL_TABLET | Freq: Every day | ORAL | 3 refills | Status: DC

## 2021-12-22 NOTE — Progress Notes (Signed)
At the time of my evaluation, the patient is alert awake oriented and in no distress. ?Cardiology Office Note:   ? ?Date:  12/22/2021  ? ?ID:  Tina Villanueva, DOB Sep 15, 1950, MRN [id number] ? ?PCP:  Isaac Bliss, Rayford Halsted, MD  ?Cardiologist:  Jenean Lindau, MD  ? ?Referring MD: Isaac Bliss, [place]*  ? ? ?ASSESSMENT:   ? ?1. Mixed hyperlipidemia   ?2. Paroxysmal atrial fibrillation (HCC)   ? ?PLAN:   ? ?In order of problems listed above: ? ?Primary prevention stressed to the patient.  Importance of compliance with diet medication stressed and she vocalized understanding.  She was advised to ambulate to the best of her ability. ?Atrial fibrillation:I discussed with the patient atrial fibrillation, disease process. Management and therapy including rate and rhythm control, anticoagulation benefits and potential risks were discussed extensively with the patient. Patient had multiple questions which were answered to patient's satisfaction. ?Hypertension: Blood pressure stable and diet was emphasized and lifestyle modification urged. ?Mixed dyslipidemia: On statin therapy and she will have blood work today.  She is fasting.  Diet emphasized. ?Morbid obesity: Weight reduction stressed and she promises to do better.  Risks of obesity emphasized. ?Patient will be seen in follow-up appointment in 6 months or earlier if the patient has any concerns ? ? ? ?Medication Adjustments/Labs and Tests Ordered: ?Current medicines are reviewed at length with the patient today.  Concerns regarding medicines are outlined above.  ?Orders Placed This Encounter  ?Procedures  ? Basic metabolic panel  ? CBC with Differential/Platelet  ? Hepatic function panel  ? Lipid panel  ? TSH  ? ?Meds ordered this encounter  ?Medications  ? rivaroxaban (XARELTO) 20 MG TABS tablet  ?  Sig: Take 1 tablet (20 mg total) by mouth daily with supper.  ?  Dispense:  90 tablet  ?  Refill:  3  ? ? ? ?No chief complaint on file. ?  ? ?History of Present  Illness:   ? ?Tina Villanueva is a 71 y.o. female.  Patient has past medical history of paroxysmal atrial fibrillation, essential hypertension and dyslipidemia and morbid obesity.  Overall she leads a sedentary lifestyle.  She denies any chest pain orthopnea or PND. ? ?Past Medical History:  ?Diagnosis Date  ? Arthritis   ? Atrial bigeminy 02/25/2021  ? Benign essential hypertension 05/02/2016  ? Formatting of this note might be different from the original. Last Assessment & Plan:  BP adequately controlled. No changes in current management. Continue low-salt diet.  ? Bilateral lower extremity edema 03/06/2017  ? Formatting of this note might be different from the original. Last Assessment & Plan:  Information about vein specialist given. Recommend compression stocking more often,HCTZ 25 mg added. Wt loss may also help.  ? Bilateral primary osteoarthritis of knee 05/02/2016  ? BMI 45.0-49.9, adult ([place]) 05/02/2016  ? Formatting of this note might be different from the original. Last Assessment & Plan:  She has lost some weight since starting dietary changes. Encouraged to continue a healthful diet and to engage in low impact physical activity. Walking in place is a good option while she is not able to go to the gym.  ? Chicken pox   ? Coronary artery disease 03/30/2021  ? Dyslipidemia 09/27/2018  ? Gastroesophageal reflux disease without esophagitis 09/27/2018  ? H/O laparoscopic adjustable gastric banding 09/27/2018  ? Heart murmur   ? Hyperlipidemia   ? Hypertension   ? IFG (impaired fasting glucose) 09/05/2016  ? Morbid obesity with  BMI of 50.0-59.9, adult ([place]) 05/02/2016  ? Neuropathy, peripheral 08/09/2017  ? Formatting of this note might be different from the original. Last Assessment & Plan:  Problem improved with nonpharmacologic treatment. Continue following with chiropractor. Good skin care and feet check periodically.  ? OSA (obstructive sleep apnea) 05/02/2016  ? Pain in left knee 09/13/2018  ? Palpitations  02/25/2021  ? Paroxysmal atrial fibrillation ([place]) 03/30/2021  ? Positive fecal occult blood test   ? Rectal bleeding   ? ? ?Past Surgical History:  ?Procedure Laterality Date  ? BIOPSY  06/09/2021  ? Procedure: BIOPSY;  Surgeon: Sharyn Creamer, MD;  Location: [facility];  Service: Gastroenterology;;  ? BREAST SURGERY    ? COLONOSCOPY WITH PROPOFOL N/A 06/09/2021  ? Procedure: COLONOSCOPY WITH PROPOFOL;  Surgeon: Sharyn Creamer, MD;  Location: [facility];  Service: Gastroenterology;  Laterality: N/A;  ? POLYPECTOMY  06/09/2021  ? Procedure: POLYPECTOMY;  Surgeon: Sharyn Creamer, MD;  Location: [facility];  Service: Gastroenterology;;  ? TONSILLECTOMY AND ADENOIDECTOMY    ? ? ?Current Medications: ?Current Meds  ?Medication Sig  ? amLODipine (NORVASC) 10 MG tablet Take 1 tablet (10 mg total) by mouth daily.  ? atorvastatin (LIPITOR) 40 MG tablet Take 1 tablet (40 mg total) by mouth daily.  ? Coenzyme Q10 ([employer]S COQ-10) 200 MG capsule Take 1 capsule (200 mg total) by mouth daily.  ? furosemide (LASIX) 20 MG tablet Take 20 mg by mouth daily as needed for edema or fluid.  ? losartan-hydrochlorothiazide (HYZAAR) 100-25 MG tablet Take 1 tablet by mouth daily.  ? OVER THE COUNTER MEDICATION Apply 1 application topically at bedtime as needed (leg pain). Magnesium calming cream  ? [DISCONTINUED] rivaroxaban (XARELTO) 20 MG TABS tablet TAKE 1 TABLET BY MOUTH ONCE DAILY WITH SUPPER  ?  ? ?Allergies:   Patient has no known allergies.  ? ?Social History  ? ?Socioeconomic History  ? Marital status: Single  ?  Spouse name: Not on file  ? Number of children: Not on file  ? Years of education: Not on file  ? Highest education level: Bachelor's degree (e.g., BA, AB, BS)  ?Occupational History  ? Not on file  ?Tobacco Use  ? Smoking status: Former  ?  Packs/day: 0.30  ?  Years: 30.00  ?  Pack years: 9.00  ?  Types: Cigarettes  ? Smokeless tobacco: Never  ?Substance and Sexual Activity  ? Alcohol use: Not on file  ?  Comment: social  drinker; now not at all   ? Drug use: Never  ? Sexual activity: Not on file  ?Other Topics Concern  ? Not on file  ?Social History Narrative  ? Live alone;   ? Lives in single level home; one level   ? Moved here due to retirement x 1 year ago  ? Brand new house  ? Walk in tub and oversized shower   ? ?Social Determinants of Health  ? ?Financial Resource Strain: Low Risk   ? Difficulty of Paying Living Expenses: Not hard at all  ?Food Insecurity: No Food Insecurity  ? Worried About Charity fundraiser in the Last Year: Never true  ? Ran Out of Food in the Last Year: Never true  ?Transportation Needs: No Transportation Needs  ? Lack of Transportation (Medical): No  ? Lack of Transportation (Non-Medical): No  ?Physical Activity: Unknown  ? Days of Exercise per Week: 0 days  ? Minutes of Exercise per Session: Not on file  ?  Stress: No Stress Concern Present  ? Feeling of Stress : Not at all  ?Social Connections: Moderately Integrated  ? Frequency of Communication with Friends and Family: More than three times a week  ? Frequency of Social Gatherings with Friends and Family: Once a week  ? Attends Religious Services: More than 4 times per year  ? Active Member of Clubs or Organizations: Yes  ? Attends Archivist Meetings: More than 4 times per year  ? Marital Status: Divorced  ?  ? ?Family History: ?The patient's family history includes Arthritis in her father and mother; Hyperlipidemia in her father and mother; Hypertension in her father and mother; Stroke in her father and mother. There is no history of Breast cancer. ? ?ROS:   ?Please see the history of present illness.    ?All other systems reviewed and are negative. ? ?EKGs/Labs/Other Studies Reviewed:   ? ?The following studies were reviewed today: ?I discussed my findings with the patient at length. ? ? ?Recent Labs: ?06/27/2021: ALT 14; BUN 13; Creatinine, Ser 0.80; Potassium 4.0; Sodium 140; TSH 2.700 ?08/16/2021: Hemoglobin 12.1; Platelets 268.0   ?Recent Lipid Panel ?   ?Component Value Date/Time  ? CHOL 203 (H) 06/27/2021 1039  ? TRIG 73 06/27/2021 1039  ? HDL 60 06/27/2021 1039  ? CHOLHDL 3.4 06/27/2021 1039  ? CHOLHDL 4 01/19/2021 1058  ? VLDL 27.6

## 2021-12-22 NOTE — Patient Instructions (Signed)
Medication Instructions:  ?Your physician recommends that you continue on your current medications as directed. Please refer to the Current Medication list given to you today. ? ?*If you need a refill on your cardiac medications before your next appointment, please call your pharmacy* ? ? ?Lab Work: ?Your physician recommends that you have labs done in the office today. Your test included  basic metabolic panel, complete blood count, TSH, liver function and lipids. ? ?If you have labs (blood work) drawn today and your tests are completely normal, you will receive your results only by: ?MyChart Message (if you have MyChart) OR ?A paper copy in the mail ?If you have any lab test that is abnormal or we need to change your treatment, we will call you to review the results. ? ? ?Testing/Procedures: ?None ordered ? ? ?Follow-Up: ?At [facility], you and your health needs are our priority.  As part of our continuing mission to provide you with exceptional heart care, we have created designated Provider Care Teams.  These Care Teams include your primary Cardiologist (physician) and Advanced Practice Providers (APPs -  Physician Assistants and Nurse Practitioners) who all work together to provide you with the care you need, when you need it. ? ?We recommend signing up for the patient portal called "MyChart".  Sign up information is provided on this After Visit Summary.  MyChart is used to connect with patients for Virtual Visits (Telemedicine).  Patients are able to view lab/test results, encounter notes, upcoming appointments, etc.  Non-urgent messages can be sent to your provider as well.   ?To learn more about what you can do with MyChart, go to [email or web address].   ? ?Your next appointment:   ?12 month(s) ? ?The format for your next appointment:   ?In Person ? ?Provider:   ?Jyl Heinz, MD ? ? ?Other Instructions ?NA   ?

## 2021-12-23 LAB — CBC WITH DIFFERENTIAL/PLATELET
Basophils Absolute: 0.1 10*3/uL (ref 0.0–0.2)
Basos: 1 %
EOS (ABSOLUTE): 0.2 10*3/uL (ref 0.0–0.4)
Eos: 3 %
Hematocrit: 37.4 % (ref 34.0–46.6)
Hemoglobin: 12.4 g/dL (ref 11.1–15.9)
Immature Grans (Abs): 0 10*3/uL (ref 0.0–0.1)
Immature Granulocytes: 0 %
Lymphocytes Absolute: 1.8 10*3/uL (ref 0.7–3.1)
Lymphs: 23 %
MCH: 29.7 pg (ref 26.6–33.0)
MCHC: 33.2 g/dL (ref 31.5–35.7)
MCV: 90 fL (ref 79–97)
Monocytes Absolute: 0.5 10*3/uL (ref 0.1–0.9)
Monocytes: 6 %
Neutrophils Absolute: 5.5 10*3/uL (ref 1.4–7.0)
Neutrophils: 67 %
Platelets: 312 10*3/uL (ref 150–450)
RBC: 4.17 x10E6/uL (ref 3.77–5.28)
RDW: 13.2 % (ref 11.7–15.4)
WBC: 8.1 10*3/uL (ref 3.4–10.8)

## 2021-12-23 LAB — HEPATIC FUNCTION PANEL
ALT: 14 IU/L (ref 0–32)
AST: 14 IU/L (ref 0–40)
Albumin: 4.2 g/dL (ref 3.7–4.7)
Alkaline Phosphatase: 126 IU/L — ABNORMAL HIGH (ref 44–121)
Bilirubin Total: 0.8 mg/dL (ref 0.0–1.2)
Bilirubin, Direct: 0.21 mg/dL (ref 0.00–0.40)
Total Protein: 6.7 g/dL (ref 6.0–8.5)

## 2021-12-23 LAB — BASIC METABOLIC PANEL
BUN/Creatinine Ratio: 14 (ref 12–28)
BUN: 12 mg/dL (ref 8–27)
CO2: 29 mmol/L (ref 20–29)
Calcium: 9.7 mg/dL (ref 8.7–10.3)
Chloride: 99 mmol/L (ref 96–106)
Creatinine, Ser: 0.86 mg/dL (ref 0.57–1.00)
Glucose: 120 mg/dL — ABNORMAL HIGH (ref 70–99)
Potassium: 3.9 mmol/L (ref 3.5–5.2)
Sodium: 142 mmol/L (ref 134–144)
eGFR: 72 mL/min/{1.73_m2} (ref >59)

## 2021-12-23 LAB — LIPID PANEL
Chol/HDL Ratio: 3.1 ratio (ref 0.0–4.4)
Cholesterol, Total: 203 mg/dL — ABNORMAL HIGH (ref 100–199)
HDL: 66 mg/dL
LDL Chol Calc (NIH): 120 mg/dL — ABNORMAL HIGH (ref 0–99)
Triglycerides: 98 mg/dL (ref 0–149)
VLDL Cholesterol Cal: 17 mg/dL (ref 5–40)

## 2021-12-23 LAB — TSH: TSH: 2.29 u[IU]/mL (ref 0.450–4.500)

## 2021-12-27 IMAGING — CT CT CARDIAC CORONARY ARTERY CALCIUM SCORE
3 series · 14 of 20 positions shown, 15 images · non-contrast
Comparison: None.
COMPARISON: None.

Addendum:
EXAM:
OVER-READ INTERPRETATION  CT CHEST

The following report is an over-read performed by radiologist Dr.
Shamsa Odwyer [REDACTED] on 03/24/2021. This over-read
does not include interpretation of cardiac or coronary anatomy or
pathology. The calcium score interpretation by the cardiologist is
attached.
CLINICAL DATA: Risk stratification: 70 Year-old African American
Female
Coronary Calcium Score
TECHNIQUE: The patient was scanned on a Siemens Force scanner. Axial
non-contrast 3 mm slices were carried out through the heart. The
data set was analyzed on a dedicated work station and scored using
the Agatson method.

[Series 2: casc 3.0 bv41 2 bestsyst 279 ms · axial · 0.46mm/px · z∈[-240,-160]mm · 4 of 45 slices shown, 5 images]
[im 9/45  vessel]
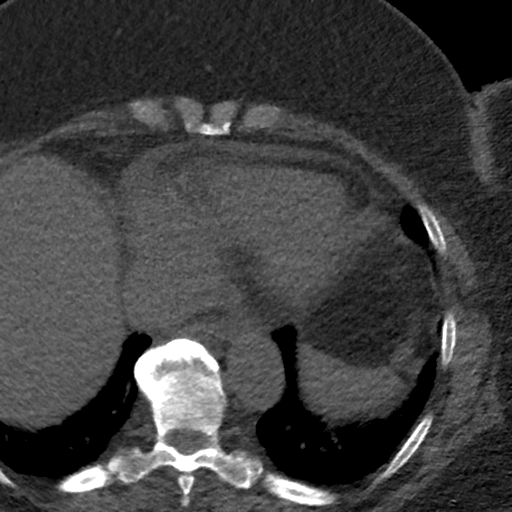
[im 9/45  lung]
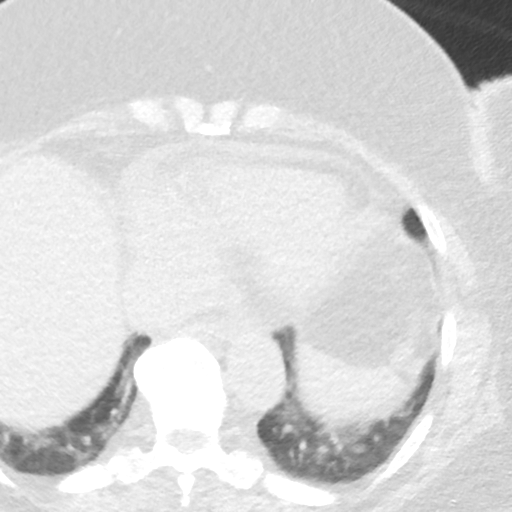
[im 18/45  vessel]
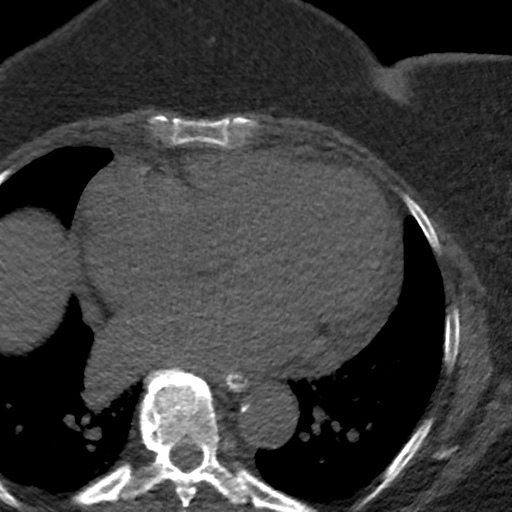
[im 27/45  vessel]
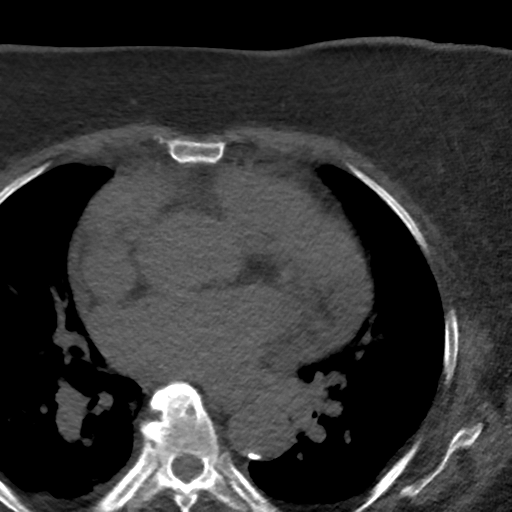
[im 36/45  vessel]
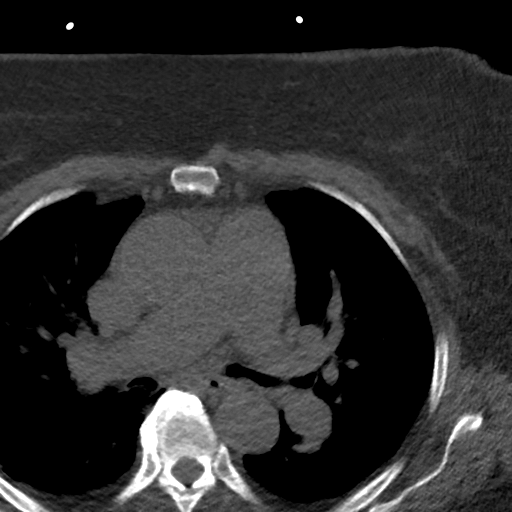

[Series 3: lung 279 ms · axial · 0.64mm/px · z∈[-244,-156]mm · 5 of 45 slices shown]
[im 8/45  lung]
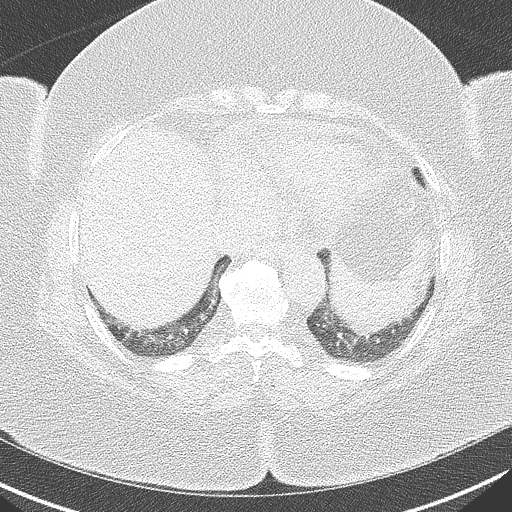
[im 15/45  lung]
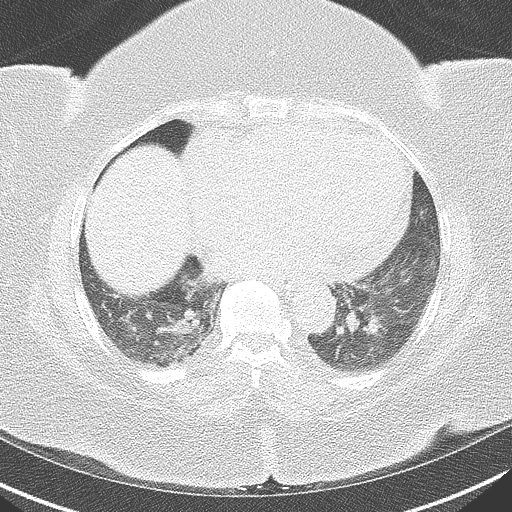
[im 23/45  lung]
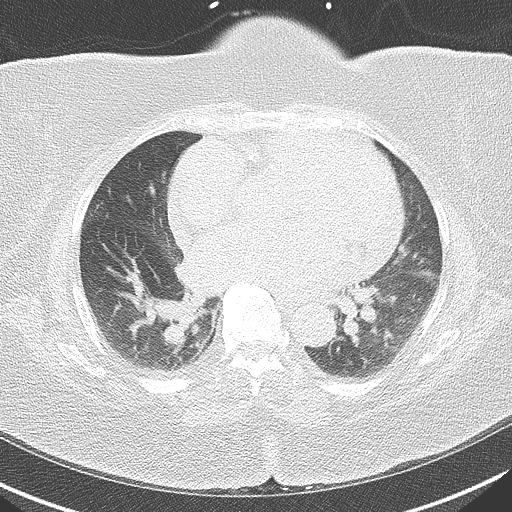
[im 30/45  lung]
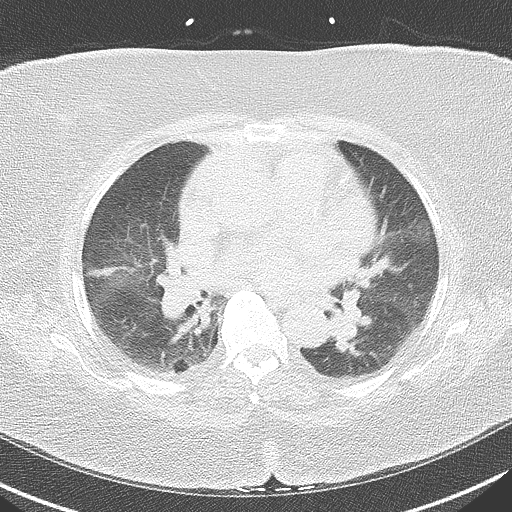
[im 37/45  lung]
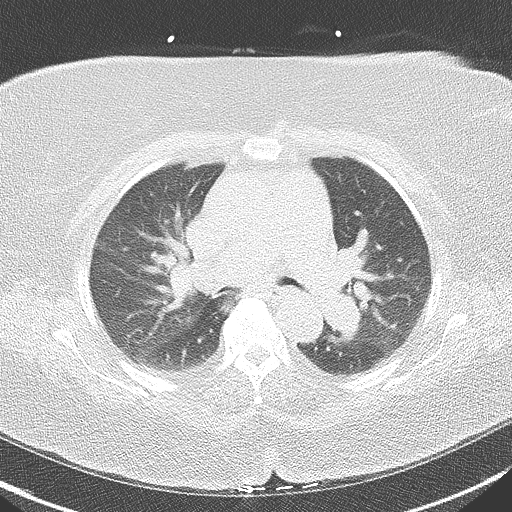

[Series 4: lung st 279 ms · axial · 0.64mm/px · z∈[-244,-156]mm · 5 of 45 slices shown]
[im 8/45  lung]
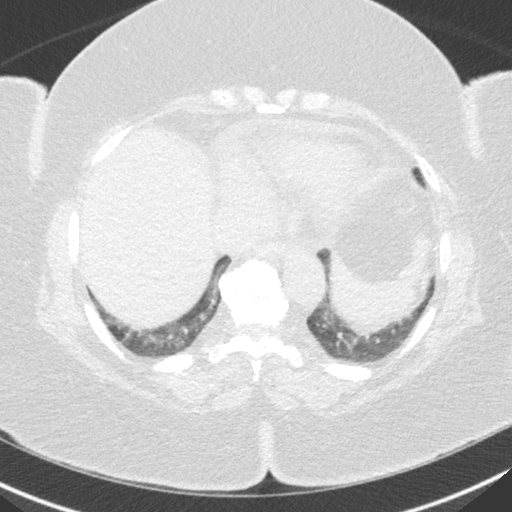
[im 15/45  lung]
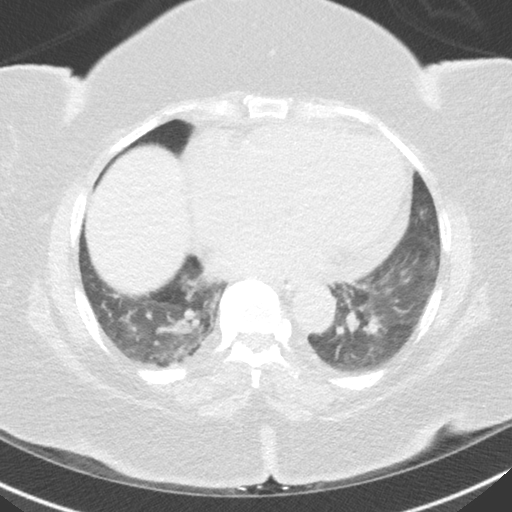
[im 23/45  lung]
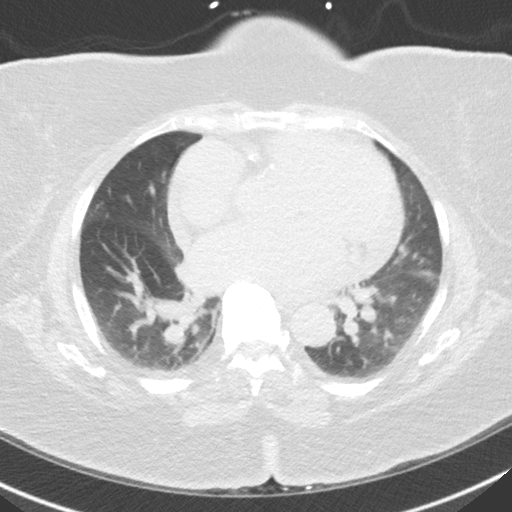
[im 30/45  lung]
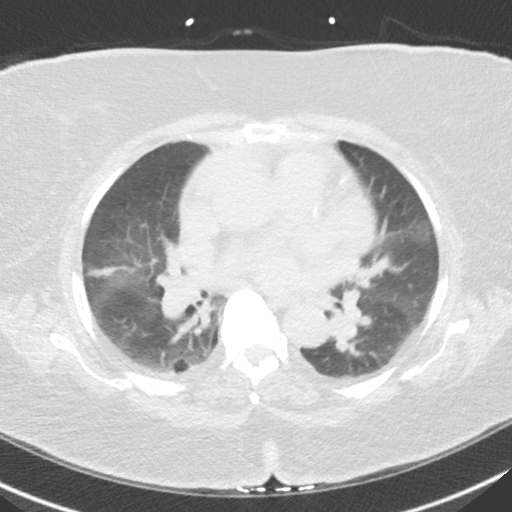
[im 37/45  lung]
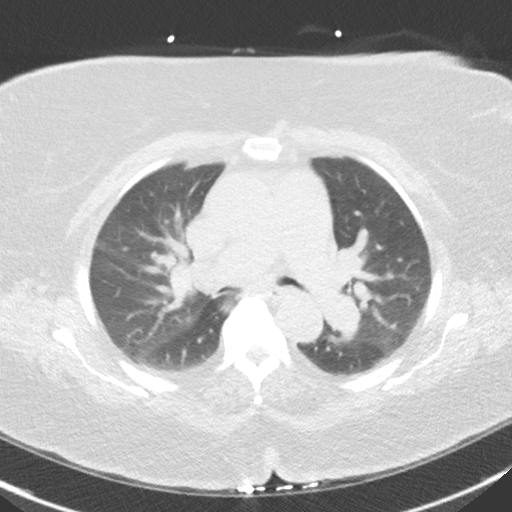

[14 of 20 positions shown; findings below may reference images not displayed]

FINDINGS: Vascular: Aortic atherosclerosis. Tortuous thoracic aorta. Pulmonary
artery enlargement, outflow tract 3.3 cm

Mediastinum/Nodes: No imaged thoracic adenopathy.

Lungs/Pleura: No pleural fluid. hypoventilation in the setting of
obesity with bibasilar subsegmental atelectasis.

Upper Abdomen: Normal imaged portions of the liver, spleen. Status
post lap band procedure.

Musculoskeletal: No acute osseous abnormality. Moderate thoracic
spondylosis.
IMPRESSION: 1. No acute process in the imaged extracardiac chest.
2. Pulmonary artery enlargement suggests pulmonary arterial
hypertension.
3. Aortic Atherosclerosis ([id number]).
FINDINGS: Non-cardiac: See separate report from [REDACTED].

Ascending Aorta: Aortic atherosclerosis.

Evidence of mild ascending aortic dilation (41 mm) on non-contrasted
study.

Evidence of moderate main pulmonary dilation on non-contrasted
study.

Pericardium: Normal.

Coronary arteries: Normal origins.

Coronary Calcium Score:

Left main: 0

Left anterior descending artery: 82

Left circumflex artery: 0

Right coronary artery: 39

Total: 121

Percentile: 79th for age, sex, and race matched control.
IMPRESSION: 1. Coronary calcium score of 121. This was 79th percentile for age,
gender, and race matched controls.

2. Evidence of mild ascending aortic dilation (41 mm) on
non-contrasted study. Evidence of moderate main pulmonary dilation
on non-contrasted study. Consider secondary imaging modality
(echocardiogram, CTA Aorta Protocol, MRA Aorta Protocol) if
clinically indicated.

3. Aortic atherosclerosis.

RECOMMENDATIONS:



If CAC = 0, it is reasonable to withhold statin therapy and reassess
in 5 to 10 years, as long as higher risk conditions are absent
(diabetes mellitus, family history of premature CHD in first degree
relatives (males <55 years; females <65 years), cigarette smoking,
LDL >=190 mg/dL or other independent risk factors).

If CAC is 1 to 99, it is reasonable to initiate statin therapy for
patients >=55 years of age.

If CAC is >=100 or >=75th percentile, it is reasonable to initiate
statin therapy at any age.

Cardiology referral should be considered for patients with CAC
scores =400 or >=75th percentile.

*1901 AHA/ACC/AACVPR/AAPA/ABC/BECCA/ANNEL/OLIMPIA/Lei/PREMA/JANVIER/GELLER
Guideline on the Management of Blood Cholesterol: A Report of the
American College of Cardiology/American Heart Association Task Force
on Clinical Practice Guidelines. J Am Coll Cardiol.
0393;73(24):4781-4733.

*** End of Addendum ***
EXAM:
OVER-READ INTERPRETATION  CT CHEST

The following report is an over-read performed by radiologist Dr.
Shamsa Odwyer [REDACTED] on 03/24/2021. This over-read
does not include interpretation of cardiac or coronary anatomy or
pathology. The calcium score interpretation by the cardiologist is
attached.
FINDINGS: Vascular: Aortic atherosclerosis. Tortuous thoracic aorta. Pulmonary
artery enlargement, outflow tract 3.3 cm

Mediastinum/Nodes: No imaged thoracic adenopathy.

Lungs/Pleura: No pleural fluid. hypoventilation in the setting of
obesity with bibasilar subsegmental atelectasis.

Upper Abdomen: Normal imaged portions of the liver, spleen. Status
post lap band procedure.

Musculoskeletal: No acute osseous abnormality. Moderate thoracic
spondylosis.
IMPRESSION: 1. No acute process in the imaged extracardiac chest.
2. Pulmonary artery enlargement suggests pulmonary arterial
hypertension.
3. Aortic Atherosclerosis (FFS0U-XN9.9).

## 2022-01-10 DIAGNOSIS — M5137 Other intervertebral disc degeneration, lumbosacral region: Secondary | ICD-10-CM | POA: Diagnosis not present

## 2022-01-10 DIAGNOSIS — M5416 Radiculopathy, lumbar region: Secondary | ICD-10-CM | POA: Diagnosis not present

## 2022-01-10 DIAGNOSIS — M5135 Other intervertebral disc degeneration, thoracolumbar region: Secondary | ICD-10-CM | POA: Diagnosis not present

## 2022-01-10 DIAGNOSIS — M9903 Segmental and somatic dysfunction of lumbar region: Secondary | ICD-10-CM | POA: Diagnosis not present

## 2022-01-10 DIAGNOSIS — M9902 Segmental and somatic dysfunction of thoracic region: Secondary | ICD-10-CM | POA: Diagnosis not present

## 2022-01-10 DIAGNOSIS — M9904 Segmental and somatic dysfunction of sacral region: Secondary | ICD-10-CM | POA: Diagnosis not present

## 2022-02-19 ENCOUNTER — Other Ambulatory Visit: Payer: Self-pay | Admitting: Internal Medicine

## 2022-02-19 DIAGNOSIS — I10 Essential (primary) hypertension: Secondary | ICD-10-CM

## 2022-02-21 DIAGNOSIS — M9903 Segmental and somatic dysfunction of lumbar region: Secondary | ICD-10-CM | POA: Diagnosis not present

## 2022-02-21 DIAGNOSIS — M9904 Segmental and somatic dysfunction of sacral region: Secondary | ICD-10-CM | POA: Diagnosis not present

## 2022-02-21 DIAGNOSIS — M5137 Other intervertebral disc degeneration, lumbosacral region: Secondary | ICD-10-CM | POA: Diagnosis not present

## 2022-02-21 DIAGNOSIS — M9902 Segmental and somatic dysfunction of thoracic region: Secondary | ICD-10-CM | POA: Diagnosis not present

## 2022-02-21 DIAGNOSIS — M5135 Other intervertebral disc degeneration, thoracolumbar region: Secondary | ICD-10-CM | POA: Diagnosis not present

## 2022-02-21 DIAGNOSIS — M5416 Radiculopathy, lumbar region: Secondary | ICD-10-CM | POA: Diagnosis not present

## 2022-03-02 ENCOUNTER — Other Ambulatory Visit: Payer: Self-pay | Admitting: Internal Medicine

## 2022-03-02 DIAGNOSIS — E782 Mixed hyperlipidemia: Secondary | ICD-10-CM

## 2022-05-26 ENCOUNTER — Other Ambulatory Visit: Payer: Self-pay | Admitting: Internal Medicine

## 2022-05-26 DIAGNOSIS — E782 Mixed hyperlipidemia: Secondary | ICD-10-CM

## 2022-07-28 ENCOUNTER — Other Ambulatory Visit: Payer: Self-pay | Admitting: Internal Medicine

## 2022-07-28 DIAGNOSIS — E782 Mixed hyperlipidemia: Secondary | ICD-10-CM

## 2022-08-09 ENCOUNTER — Other Ambulatory Visit: Payer: Self-pay | Admitting: Internal Medicine

## 2022-08-09 DIAGNOSIS — I10 Essential (primary) hypertension: Secondary | ICD-10-CM

## 2022-09-07 ENCOUNTER — Telehealth: Payer: Self-pay

## 2022-09-07 NOTE — Telephone Encounter (Signed)
   Pre-operative Risk Assessment    Patient Name: Tina Villanueva  DOB: [birth year] MRN: [id number]     Request for Surgical Clearance    Procedure:  Dental Extraction - Amount of Teeth to be Pulled:  1  Date of Surgery:  Clearance TBD                                 Surgeon:   Surgeon's Group or Practice Name:  [facility] [place] Phone number:  [phone] Fax number:  [phone]   Type of Clearance Requested:   - Pharmacy:  Hold Rivaroxaban (Xarelto) please advise   Type of Anesthesia:  Not Indicated   Additional requests/questions:  Does this patient need antibiotics?  Signed, Lowella Grip   09/07/2022, 4:34 PM

## 2022-09-08 NOTE — Telephone Encounter (Signed)
   Primary Cardiologist: Jenean Lindau, MD  Chart reviewed as part of pre-operative protocol coverage. Simple dental extractions (1-2 teeth) are considered low risk procedures per guidelines and generally do not require any specific cardiac clearance. It is also generally accepted that for simple extractions and dental cleanings, there is no need to interrupt blood thinner therapy.   SBE prophylaxis is not required for the patient.  I will route this recommendation to the requesting party via Epic fax function and remove from pre-op pool.  Please call with questions.  Emmaline Life, NP-C  09/08/2022, 12:50 PM [address] Office [phone] Fax [phone]

## 2022-09-08 NOTE — Telephone Encounter (Signed)
Caller stated patient will be having one tooth extracted and would like updated clearance faxed to [phone].

## 2022-09-29 ENCOUNTER — Other Ambulatory Visit: Payer: Self-pay | Admitting: Internal Medicine

## 2022-09-29 DIAGNOSIS — E782 Mixed hyperlipidemia: Secondary | ICD-10-CM

## 2022-10-02 DIAGNOSIS — H25813 Combined forms of age-related cataract, bilateral: Secondary | ICD-10-CM | POA: Diagnosis not present

## 2022-10-02 DIAGNOSIS — H40013 Open angle with borderline findings, low risk, bilateral: Secondary | ICD-10-CM | POA: Diagnosis not present

## 2022-10-10 ENCOUNTER — Other Ambulatory Visit: Payer: Self-pay | Admitting: Internal Medicine

## 2022-10-10 DIAGNOSIS — I10 Essential (primary) hypertension: Secondary | ICD-10-CM

## 2022-12-01 ENCOUNTER — Other Ambulatory Visit: Payer: Self-pay | Admitting: Internal Medicine

## 2022-12-01 DIAGNOSIS — E782 Mixed hyperlipidemia: Secondary | ICD-10-CM

## 2023-01-16 ENCOUNTER — Telehealth: Payer: Self-pay | Admitting: Internal Medicine

## 2023-01-16 ENCOUNTER — Ambulatory Visit (INDEPENDENT_AMBULATORY_CARE_PROVIDER_SITE_OTHER): Payer: Medicare Other | Admitting: Internal Medicine

## 2023-01-16 VITALS — BP 124/79 | HR 87 | Temp 97.8°F | Wt 298.6 lb

## 2023-01-16 DIAGNOSIS — R600 Localized edema: Secondary | ICD-10-CM | POA: Diagnosis not present

## 2023-01-16 DIAGNOSIS — E782 Mixed hyperlipidemia: Secondary | ICD-10-CM

## 2023-01-16 DIAGNOSIS — G4733 Obstructive sleep apnea (adult) (pediatric): Secondary | ICD-10-CM

## 2023-01-16 DIAGNOSIS — I872 Venous insufficiency (chronic) (peripheral): Secondary | ICD-10-CM | POA: Diagnosis not present

## 2023-01-16 DIAGNOSIS — I10 Essential (primary) hypertension: Secondary | ICD-10-CM

## 2023-01-16 DIAGNOSIS — Z6841 Body Mass Index (BMI) 40.0 and over, adult: Secondary | ICD-10-CM | POA: Diagnosis not present

## 2023-01-16 DIAGNOSIS — I480 Paroxysmal atrial fibrillation: Secondary | ICD-10-CM

## 2023-01-16 DIAGNOSIS — R7301 Impaired fasting glucose: Secondary | ICD-10-CM

## 2023-01-16 LAB — COMPREHENSIVE METABOLIC PANEL
ALT: 19 U/L (ref 0–35)
AST: 20 U/L (ref 0–37)
Albumin: 4.4 g/dL (ref 3.5–5.2)
Alkaline Phosphatase: 112 U/L (ref 39–117)
BUN: 14 mg/dL (ref 6–23)
CO2: 32 mEq/L (ref 19–32)
Calcium: 9.7 mg/dL (ref 8.4–10.5)
Chloride: 99 mEq/L (ref 96–112)
Creatinine, Ser: 0.81 mg/dL (ref 0.40–1.20)
GFR: 72.65 mL/min (ref >60.00)
Glucose, Bld: 116 mg/dL — ABNORMAL HIGH (ref 70–99)
Potassium: 3.7 mEq/L (ref 3.5–5.1)
Sodium: 140 mEq/L (ref 135–145)
Total Bilirubin: 1.2 mg/dL (ref 0.2–1.2)
Total Protein: 7.4 g/dL (ref 6.0–8.3)

## 2023-01-16 LAB — CBC WITH DIFFERENTIAL/PLATELET
Basophils Absolute: 0 10*3/uL (ref 0.0–0.1)
Basophils Relative: 0.6 % (ref 0.0–3.0)
Eosinophils Absolute: 0.2 10*3/uL (ref 0.0–0.7)
Eosinophils Relative: 2.7 % (ref 0.0–5.0)
HCT: 39.5 % (ref 36.0–46.0)
Hemoglobin: 12.7 g/dL (ref 12.0–15.0)
Lymphocytes Relative: 21.7 % (ref 12.0–46.0)
Lymphs Abs: 1.5 10*3/uL (ref 0.7–4.0)
MCHC: 32.2 g/dL (ref 30.0–36.0)
MCV: 89.7 fl (ref 78.0–100.0)
Monocytes Absolute: 0.5 10*3/uL (ref 0.1–1.0)
Monocytes Relative: 7.4 % (ref 3.0–12.0)
Neutro Abs: 4.6 10*3/uL (ref 1.4–7.7)
Neutrophils Relative %: 67.6 % (ref 43.0–77.0)
Platelets: 296 10*3/uL (ref 150.0–400.0)
RBC: 4.4 Mil/uL (ref 3.87–5.11)
RDW: 14.6 % (ref 11.5–15.5)
WBC: 6.7 10*3/uL (ref 4.0–10.5)

## 2023-01-16 LAB — LIPID PANEL
Cholesterol: 178 mg/dL (ref 0–200)
HDL: 66.1 mg/dL (ref >39.00)
LDL Cholesterol: 96 mg/dL (ref 0–99)
NonHDL: 111.75
Total CHOL/HDL Ratio: 3
Triglycerides: 78 mg/dL (ref 0.0–149.0)
VLDL: 15.6 mg/dL (ref 0.0–40.0)

## 2023-01-16 LAB — HEMOGLOBIN A1C: Hgb A1c MFr Bld: 6.1 % (ref 4.6–6.5)

## 2023-01-16 MED ORDER — FUROSEMIDE 20 MG PO TABS: 20.0000 mg | ORAL_TABLET | Freq: Every day | ORAL | 3 refills | Status: DC

## 2023-01-16 NOTE — Telephone Encounter (Signed)
Tina Villanueva, triage nurse, called stating pt is having leaking fluid from her legs, she has a history of blood clots and high blood pressure. She recommended pt be seen within 4 hours. I let her know we do not have any available appts in the next 4 hours, but I would send high priority msg to provider.

## 2023-01-16 NOTE — Telephone Encounter (Signed)
Appointment scheduled.

## 2023-01-16 NOTE — Progress Notes (Signed)
Established Patient Office Visit     CC/Reason for Visit: Swollen, weeping legs.  HPI: Tina Villanueva is a 72 y.o. female who is coming in today for the above mentioned reasons. Asked by RN triage to be seen within 4 hours due to swollen legs. I have not seen her since 12/22. Has not seen cardiology since 4/23. Her PMH is significant MO, afib on xarelto, HTN, HLD, IGT and OSA. She has always had swollen legs. In the last week they started weeping. Denies CP and SOB.   Past Medical/Surgical History: Past Medical History:  Diagnosis Date   Arthritis    Atrial bigeminy 02/25/2021   Benign essential hypertension 05/02/2016   Formatting of this note might be different from the original. Last Assessment & Plan:  BP adequately controlled. No changes in current management. Continue low-salt diet.   Bilateral lower extremity edema 03/06/2017   Formatting of this note might be different from the original. Last Assessment & Plan:  Information about vein specialist given. Recommend compression stocking more often,HCTZ 25 mg added. Wt loss may also help.   Bilateral primary osteoarthritis of knee 05/02/2016   BMI 45.0-49.9, adult ([facility]) 05/02/2016   Formatting of this note might be different from the original. Last Assessment & Plan:  She has lost some weight since starting dietary changes. Encouraged to continue a healthful diet and to engage in low impact physical activity. Walking in place is a good option while she is not able to go to the gym.   Chicken pox    Coronary artery disease 03/30/2021   Dyslipidemia 09/27/2018   Gastroesophageal reflux disease without esophagitis 09/27/2018   H/O laparoscopic adjustable gastric banding 09/27/2018   Heart murmur    Hyperlipidemia    Hypertension    IFG (impaired fasting glucose) 09/05/2016   Morbid obesity with BMI of 50.0-59.9, adult ([facility]) 05/02/2016   Neuropathy, peripheral 08/09/2017   Formatting of this note might be different from the  original. Last Assessment & Plan:  Problem improved with nonpharmacologic treatment. Continue following with chiropractor. Good skin care and feet check periodically.   OSA (obstructive sleep apnea) 05/02/2016   Pain in left knee 09/13/2018   Palpitations 02/25/2021   Paroxysmal atrial fibrillation (HCC) 03/30/2021   Positive fecal occult blood test    Rectal bleeding     Past Surgical History:  Procedure Laterality Date   BIOPSY  06/09/2021   Procedure: BIOPSY;  Surgeon: Imogene Burn, MD;  Location: [facility];  Service: Gastroenterology;;   BREAST SURGERY     COLONOSCOPY WITH PROPOFOL N/A 06/09/2021   Procedure: COLONOSCOPY WITH PROPOFOL;  Surgeon: Imogene Burn, MD;  Location: [facility];  Service: Gastroenterology;  Laterality: N/A;   POLYPECTOMY  06/09/2021   Procedure: POLYPECTOMY;  Surgeon: Imogene Burn, MD;  Location: [facility];  Service: Gastroenterology;;   TONSILLECTOMY AND ADENOIDECTOMY      Social History:  reports that she has quit smoking. Her smoking use included cigarettes. She has a 9.00 pack-year smoking history. She has never used smokeless tobacco. She reports that she does not use drugs. No history on file for alcohol use.  Allergies: No Known Allergies  Family History:  Family History  Problem Relation Age of Onset   Arthritis Mother    Hyperlipidemia Mother    Hypertension Mother    Stroke Mother    Arthritis Father    Hyperlipidemia Father    Hypertension Father    Stroke Father  Breast cancer Neg Hx      Current Outpatient Medications:    amLODipine (NORVASC) 10 MG tablet, TAKE 1 TABLET BY MOUTH DAILY, Disp: 90 tablet, Rfl: 0   atorvastatin (LIPITOR) 40 MG tablet, TAKE 1 TABLET BY MOUTH DAILY, Disp: 90 tablet, Rfl: 0   Coenzyme Q10 ([employer]S COQ-10) 200 MG capsule, Take 1 capsule (200 mg total) by mouth daily., Disp: 90 capsule, Rfl: 3   furosemide (LASIX) 20 MG tablet, Take 1 tablet (20 mg total) by mouth daily., Disp: 30 tablet, Rfl:  3   losartan-hydrochlorothiazide (HYZAAR) 100-25 MG tablet, TAKE 1 TABLET BY MOUTH DAILY, Disp: 90 tablet, Rfl: 0   OVER THE COUNTER MEDICATION, Apply 1 application topically at bedtime as needed (leg pain). Magnesium calming cream, Disp: , Rfl:    rivaroxaban (XARELTO) 20 MG TABS tablet, Take 1 tablet (20 mg total) by mouth daily with supper., Disp: 90 tablet, Rfl: 3  Review of Systems:  Negative unless indicated in HPI.   Physical Exam: Vitals:   01/16/23 1137 01/16/23 1141  BP: (!) 141/82 124/79  Pulse: 87   Temp: 97.8 F (36.6 C)   TempSrc: Oral   SpO2: 99%   Weight: 298 lb 9.6 oz (135.4 kg)     Body mass index is 54.61 kg/m.   Physical Exam Vitals reviewed.  Constitutional:      Appearance: Normal appearance.  HENT:     Head: Normocephalic and atraumatic.  Eyes:     Conjunctiva/sclera: Conjunctivae normal.     Pupils: Pupils are equal, round, and reactive to light.  Cardiovascular:     Rate and Rhythm: Normal rate and regular rhythm.  Pulmonary:     Effort: Pulmonary effort is normal.     Breath sounds: Normal breath sounds.  Musculoskeletal:     Right lower leg: 4+ Pitting Edema present.     Left lower leg: 4+ Pitting Edema present.     Comments: Dark skin color changes lower legs L>R. Varicosities are apparent on lower legs.  Skin:    General: Skin is warm and dry.  Neurological:     General: No focal deficit present.     Mental Status: She is alert and oriented to person, place, and time.  Psychiatric:        Mood and Affect: Mood normal.        Behavior: Behavior normal.        Thought Content: Thought content normal.        Judgment: Judgment normal.      Impression and Plan:  Bilateral lower extremity edema -     Furosemide; Take 1 tablet (20 mg total) by mouth daily.  Dispense: 30 tablet; Refill: 3  Chronic venous insufficiency  Morbid obesity with BMI of 50.0-59.9, adult (HCC)  OSA (obstructive sleep apnea)  IFG (impaired fasting  glucose) -     Hemoglobin A1c; Future  Paroxysmal atrial fibrillation (HCC)  Primary hypertension -     CBC with Differential/Platelet; Future -     Comprehensive metabolic panel; Future  Mixed hyperlipidemia -     Lipid panel; Future   -Given skin color changes, varicosities and significant edema, suspect chronic venous insufficiency. Maybe some degree of diastolic dysfunction altho ECHO from 2022 with normal EF. Advised compression stockings, will resume lasix 20 mg daily. Check labs today. Have advised she follow up with her cardiologist. Has been >1 year since her last OV with them.  Time spent:32 minutes reviewing chart, interviewing and examining patient  and formulating plan of care.     Chaya Jan, MD [facility]

## 2023-02-19 ENCOUNTER — Ambulatory Visit (INDEPENDENT_AMBULATORY_CARE_PROVIDER_SITE_OTHER): Payer: Medicare Other | Admitting: Internal Medicine

## 2023-02-19 ENCOUNTER — Encounter: Payer: Self-pay | Admitting: Internal Medicine

## 2023-02-19 VITALS — BP 130/84 | HR 64 | Temp 97.9°F | Wt 296.3 lb

## 2023-02-19 DIAGNOSIS — E782 Mixed hyperlipidemia: Secondary | ICD-10-CM | POA: Diagnosis not present

## 2023-02-19 DIAGNOSIS — R7301 Impaired fasting glucose: Secondary | ICD-10-CM

## 2023-02-19 DIAGNOSIS — Z6841 Body Mass Index (BMI) 40.0 and over, adult: Secondary | ICD-10-CM

## 2023-02-19 DIAGNOSIS — G4733 Obstructive sleep apnea (adult) (pediatric): Secondary | ICD-10-CM | POA: Diagnosis not present

## 2023-02-19 DIAGNOSIS — R600 Localized edema: Secondary | ICD-10-CM | POA: Diagnosis not present

## 2023-02-19 MED ORDER — FUROSEMIDE 20 MG PO TABS: 20.0000 mg | ORAL_TABLET | Freq: Every day | ORAL | 1 refills | Status: DC

## 2023-02-19 NOTE — Assessment & Plan Note (Signed)
Due to chronic venous insufficiency. Continue compression stockings and lasix 20 mg daily.

## 2023-02-19 NOTE — Assessment & Plan Note (Signed)
Recent LDL 96. On atorvastatin 40 mg daily.

## 2023-02-19 NOTE — Assessment & Plan Note (Signed)
Recent A1c was 6.1. Discussed lifestyle changes.

## 2023-02-19 NOTE — Assessment & Plan Note (Signed)
Discussed healthy lifestyle, including increased physical activity and better food choices to promote weight loss.  

## 2023-02-19 NOTE — Progress Notes (Signed)
Established Patient Office Visit     CC/Reason for Visit: Follow-up chronic medical conditions  HPI: Tina Villanueva is a 72 y.o. female who is coming in today for the above mentioned reasons. Past Medical History is significant for: Morbid obesity, A-fib, bilateral lower extremity edema related to chronic venous insufficiency, impaired glucose tolerance, hypertension, hyperlipidemia.  She is wearing her compression stockings, edema has improved with Lasix.  She has no acute concerns.  She is overdue for wellness visit.   Past Medical/Surgical History: Past Medical History:  Diagnosis Date   Arthritis    Atrial bigeminy 02/25/2021   Benign essential hypertension 05/02/2016   Formatting of this note might be different from the original. Last Assessment & Plan:  BP adequately controlled. No changes in current management. Continue low-salt diet.   Bilateral lower extremity edema 03/06/2017   Formatting of this note might be different from the original. Last Assessment & Plan:  Information about vein specialist given. Recommend compression stocking more often,HCTZ 25 mg added. Wt loss may also help.   Bilateral primary osteoarthritis of knee 05/02/2016   BMI 45.0-49.9, adult (HCC) 05/02/2016   Formatting of this note might be different from the original. Last Assessment & Plan:  She has lost some weight since starting dietary changes. Encouraged to continue a healthful diet and to engage in low impact physical activity. Walking in place is a good option while she is not able to go to the gym.   Chicken pox    Coronary artery disease 03/30/2021   Dyslipidemia 09/27/2018   Gastroesophageal reflux disease without esophagitis 09/27/2018   H/O laparoscopic adjustable gastric banding 09/27/2018   Heart murmur    Hyperlipidemia    Hypertension    IFG (impaired fasting glucose) 09/05/2016   Morbid obesity with BMI of 50.0-59.9, adult (HCC) 05/02/2016   Neuropathy, peripheral 08/09/2017    Formatting of this note might be different from the original. Last Assessment & Plan:  Problem improved with nonpharmacologic treatment. Continue following with chiropractor. Good skin care and feet check periodically.   OSA (obstructive sleep apnea) 05/02/2016   Pain in left knee 09/13/2018   Palpitations 02/25/2021   Paroxysmal atrial fibrillation (HCC) 03/30/2021   Positive fecal occult blood test    Rectal bleeding     Past Surgical History:  Procedure Laterality Date   BIOPSY  06/09/2021   Procedure: BIOPSY;  Surgeon: Imogene Burn, MD;  Location: [facility];  Service: Gastroenterology;;   BREAST SURGERY     COLONOSCOPY WITH PROPOFOL N/A 06/09/2021   Procedure: COLONOSCOPY WITH PROPOFOL;  Surgeon: Imogene Burn, MD;  Location: [facility];  Service: Gastroenterology;  Laterality: N/A;   POLYPECTOMY  06/09/2021   Procedure: POLYPECTOMY;  Surgeon: Imogene Burn, MD;  Location: [facility];  Service: Gastroenterology;;   TONSILLECTOMY AND ADENOIDECTOMY      Social History:  reports that she has quit smoking. Her smoking use included cigarettes. She has a 9.00 pack-year smoking history. She has never used smokeless tobacco. She reports that she does not use drugs. No history on file for alcohol use.  Allergies: No Known Allergies  Family History:  Family History  Problem Relation Age of Onset   Arthritis Mother    Hyperlipidemia Mother    Hypertension Mother    Stroke Mother    Arthritis Father    Hyperlipidemia Father    Hypertension Father    Stroke Father    Breast cancer Neg Hx  Current Outpatient Medications:    amLODipine (NORVASC) 10 MG tablet, TAKE 1 TABLET BY MOUTH DAILY, Disp: 90 tablet, Rfl: 0   atorvastatin (LIPITOR) 40 MG tablet, TAKE 1 TABLET BY MOUTH DAILY, Disp: 90 tablet, Rfl: 0   Coenzyme Q10 ([employer] COQ-10) 200 MG capsule, Take 1 capsule (200 mg total) by mouth daily., Disp: 90 capsule, Rfl: 3   losartan-hydrochlorothiazide (HYZAAR) 100-25 MG  tablet, TAKE 1 TABLET BY MOUTH DAILY, Disp: 90 tablet, Rfl: 0   OVER THE COUNTER MEDICATION, Apply 1 application topically at bedtime as needed (leg pain). Magnesium calming cream, Disp: , Rfl:    rivaroxaban (XARELTO) 20 MG TABS tablet, Take 1 tablet (20 mg total) by mouth daily with supper., Disp: 90 tablet, Rfl: 3   furosemide (LASIX) 20 MG tablet, Take 1 tablet (20 mg total) by mouth daily., Disp: 90 tablet, Rfl: 1  Review of Systems:  Negative unless indicated in HPI.   Physical Exam: Vitals:   02/19/23 1059  BP: 130/84  Pulse: 64  Temp: 97.9 F (36.6 C)  TempSrc: Oral  SpO2: 98%  Weight: 296 lb 4.8 oz (134.4 kg)    Body mass index is 54.19 kg/m.   Physical Exam Vitals reviewed.  Constitutional:      Appearance: Normal appearance.  HENT:     Head: Normocephalic and atraumatic.  Eyes:     Conjunctiva/sclera: Conjunctivae normal.     Pupils: Pupils are equal, round, and reactive to light.  Cardiovascular:     Rate and Rhythm: Normal rate and regular rhythm.  Pulmonary:     Effort: Pulmonary effort is normal.     Breath sounds: Normal breath sounds.  Musculoskeletal:        General: Swelling present.  Skin:    General: Skin is warm and dry.  Neurological:     General: No focal deficit present.     Mental Status: She is alert and oriented to person, place, and time.  Psychiatric:        Mood and Affect: Mood normal.        Behavior: Behavior normal.        Thought Content: Thought content normal.        Judgment: Judgment normal.      Impression and Plan:  IFG (impaired fasting glucose) Assessment & Plan: Recent A1c was 6.1. Discussed lifestyle changes.   Bilateral lower extremity edema Assessment & Plan: Due to chronic venous insufficiency. Continue compression stockings and lasix 20 mg daily.  Orders: -     Furosemide; Take 1 tablet (20 mg total) by mouth daily.  Dispense: 90 tablet; Refill: 1  Morbid obesity with BMI of 50.0-59.9, adult  [facility]) Assessment & Plan: -Discussed healthy lifestyle, including increased physical activity and better food choices to promote weight loss.    OSA (obstructive sleep apnea)  Mixed hyperlipidemia Assessment & Plan: Recent LDL 96. On atorvastatin 40 mg daily.      Time spent:32 minutes reviewing chart, interviewing and examining patient and formulating plan of care.     Chaya Jan, MD [facility]

## 2023-02-23 ENCOUNTER — Other Ambulatory Visit: Payer: Self-pay | Admitting: Cardiology

## 2023-02-23 DIAGNOSIS — I480 Paroxysmal atrial fibrillation: Secondary | ICD-10-CM

## 2023-02-23 DIAGNOSIS — E782 Mixed hyperlipidemia: Secondary | ICD-10-CM

## 2023-02-26 ENCOUNTER — Telehealth: Payer: Self-pay | Admitting: Cardiology

## 2023-02-26 DIAGNOSIS — I480 Paroxysmal atrial fibrillation: Secondary | ICD-10-CM

## 2023-02-26 DIAGNOSIS — E782 Mixed hyperlipidemia: Secondary | ICD-10-CM

## 2023-02-26 NOTE — Telephone Encounter (Signed)
Prescription refill request for Xarelto received.  Indication: Afib  Last office visit: 12/22/21 (Revankar)  Weight: 134.4kg Age: 72 Scr: 0.81 (01/16/23)  CrCl: 133.61ml/min  Office visit overdue. Pt has scheduled appt on 04/04/23 with Dr Tomie China. Refill sent.

## 2023-02-26 NOTE — Telephone Encounter (Signed)
Refill sent.

## 2023-02-26 NOTE — Telephone Encounter (Signed)
*  STAT* If patient is at the pharmacy, call can be transferred to refill team.   1. Which medications need to be refilled? (please list name of each medication and dose if known) XARELTO 20 MG TABS tablet   2. Which pharmacy/location (including street and city if local pharmacy) is medication to be sent to? [facility] - [place] ([place] - [address]  3. Do they need a 30 day or 90 day supply? 90

## 2023-04-04 ENCOUNTER — Ambulatory Visit: Payer: Medicare Other | Attending: Cardiology | Admitting: Cardiology

## 2023-04-04 ENCOUNTER — Encounter: Payer: Self-pay | Admitting: Cardiology

## 2023-04-04 VITALS — BP 122/82 | HR 82 | Ht 62.0 in | Wt 306.1 lb

## 2023-04-04 DIAGNOSIS — I480 Paroxysmal atrial fibrillation: Secondary | ICD-10-CM | POA: Diagnosis not present

## 2023-04-04 DIAGNOSIS — Z6841 Body Mass Index (BMI) 40.0 and over, adult: Secondary | ICD-10-CM | POA: Diagnosis not present

## 2023-04-04 DIAGNOSIS — I10 Essential (primary) hypertension: Secondary | ICD-10-CM

## 2023-04-04 DIAGNOSIS — Z9884 Bariatric surgery status: Secondary | ICD-10-CM

## 2023-04-04 DIAGNOSIS — E785 Hyperlipidemia, unspecified: Secondary | ICD-10-CM

## 2023-04-04 DIAGNOSIS — I2510 Atherosclerotic heart disease of native coronary artery without angina pectoris: Secondary | ICD-10-CM

## 2023-04-04 NOTE — Patient Instructions (Addendum)
Medication Instructions:  Your physician recommends that you continue on your current medications as directed. Please refer to the Current Medication list given to you today.  *If you need a refill on your cardiac medications before your next appointment, please call your pharmacy*  Lab Work: None ordered today.  Testing/Procedures: None ordered today.  Follow-Up: At [facility], you and your health needs are our priority.  As part of our continuing mission to provide you with exceptional heart care, we have created designated Provider Care Teams.  These Care Teams include your primary Cardiologist (physician) and Advanced Practice Providers (APPs -  Physician Assistants and Nurse Practitioners) who all work together to provide you with the care you need, when you need it.  Your next appointment:   9 month(s)  The format for your next appointment:   In Person  Provider:   Belva Crome, MD{

## 2023-04-04 NOTE — Progress Notes (Signed)
Cardiology Office Note:    Date:  04/04/2023   ID:  Tina Villanueva, DOB 1950/10/16, MRN [id number]  PCP:  Tina Villanueva, Tina Patricia, MD  Cardiologist:  Tina Brothers, MD   Referring MD: Tina Villanueva, Estel*    ASSESSMENT:    1. Paroxysmal atrial fibrillation (HCC)   2. Benign essential hypertension   3. Coronary artery disease involving native coronary artery of native heart without angina pectoris   4. Dyslipidemia   5. H/O laparoscopic adjustable gastric banding   6. Morbid obesity with BMI of 50.0-59.9, adult (HCC)    PLAN:    In order of problems listed above:  Primary prevention stressed with the patient.  Importance of compliance with diet medication stressed and patient verbalized standing. Essential hypertension: Blood pressure stable. Lifestyle modification measures. Persistent atrial fibrillation:I discussed with the patient atrial fibrillation, disease process. Management and therapy including rate and rhythm control, anticoagulation benefits and potential risks were discussed extensively with the patient. Patient had multiple questions which were answered to patient's satisfaction. Mixed dyslipidemia: On lipid-lowering medications followed by primary care.  Lipids reviewed from K PN sheet.  Diet advised. Morbid obesity: Weight reduction stressed and she promises to do better.  Risks of obesity explained. Patient will be seen in follow-up appointment in 6 months or earlier if the patient has any concerns.    Medication Adjustments/Labs and Tests Ordered: Current medicines are reviewed at length with the patient today.  Concerns regarding medicines are outlined above.  Orders Placed This Encounter  Procedures   EKG 12-Lead   No orders of the defined types were placed in this encounter.    No chief complaint on file.    History of Present Illness:    Tina Villanueva is a 72 y.o. female.  Patient has past medical history of essential hypertension, mixed  dyslipidemia, persistent atrial fibrillation and morbid obesity.  She leads a sedentary lifestyle.  She denies any chest pain orthopnea or PND.  She takes care of activities of daily living and ambulates with a walker.  At the time of my evaluation, the patient is alert awake oriented and in no distress.  Past Medical History:  Diagnosis Date   Arthritis    Atrial bigeminy 02/25/2021   Benign essential hypertension 05/02/2016   Formatting of this note might be different from the original. Last Assessment & Plan:  BP adequately controlled. No changes in current management. Continue low-salt diet.   Bilateral lower extremity edema 03/06/2017   Formatting of this note might be different from the original. Last Assessment & Plan:  Information about vein specialist given. Recommend compression stocking more often,HCTZ 25 mg added. Wt loss may also help.   Bilateral primary osteoarthritis of knee 05/02/2016   BMI 45.0-49.9, adult (HCC) 05/02/2016   Formatting of this note might be different from the original. Last Assessment & Plan:  She has lost some weight since starting dietary changes. Encouraged to continue a healthful diet and to engage in low impact physical activity. Walking in place is a good option while she is not able to go to the gym.   Chicken pox    Coronary artery disease 03/30/2021   Dyslipidemia 09/27/2018   Gastroesophageal reflux disease without esophagitis 09/27/2018   H/O laparoscopic adjustable gastric banding 09/27/2018   Heart murmur    Hyperlipidemia    Hypertension    IFG (impaired fasting glucose) 09/05/2016   Morbid obesity with BMI of 50.0-59.9, adult (HCC) 05/02/2016   Neuropathy, peripheral 08/09/2017  Formatting of this note might be different from the original. Last Assessment & Plan:  Problem improved with nonpharmacologic treatment. Continue following with chiropractor. Good skin care and feet check periodically.   OSA (obstructive sleep apnea) 05/02/2016   Pain  in left knee 09/13/2018   Palpitations 02/25/2021   Paroxysmal atrial fibrillation (HCC) 03/30/2021   Positive fecal occult blood test    Rectal bleeding     Past Surgical History:  Procedure Laterality Date   BIOPSY  06/09/2021   Procedure: BIOPSY;  Surgeon: Tina Burn, MD;  Location: [facility];  Service: Gastroenterology;;   BREAST SURGERY     COLONOSCOPY WITH PROPOFOL N/A 06/09/2021   Procedure: COLONOSCOPY WITH PROPOFOL;  Surgeon: Tina Burn, MD;  Location: [facility];  Service: Gastroenterology;  Laterality: N/A;   POLYPECTOMY  06/09/2021   Procedure: POLYPECTOMY;  Surgeon: Tina Burn, MD;  Location: [facility];  Service: Gastroenterology;;   TONSILLECTOMY AND ADENOIDECTOMY      Current Medications: Current Meds  Medication Sig   amLODipine (NORVASC) 10 MG tablet TAKE 1 TABLET BY MOUTH DAILY   atorvastatin (LIPITOR) 40 MG tablet TAKE 1 TABLET BY MOUTH DAILY   Coenzyme Q10 ([employer]S COQ-10) 200 MG capsule Take 1 capsule (200 mg total) by mouth daily.   furosemide (LASIX) 20 MG tablet Take 1 tablet (20 mg total) by mouth daily.   losartan-hydrochlorothiazide (HYZAAR) 100-25 MG tablet TAKE 1 TABLET BY MOUTH DAILY   OVER THE COUNTER MEDICATION Apply 1 application topically at bedtime as needed (leg pain). Magnesium calming cream   XARELTO 20 MG TABS tablet TAKE 1 TABLET BY MOUTH ONCE DAILY WITH SUPPER     Allergies:   Patient has no known allergies.   Social History   Socioeconomic History   Marital status: Single    Spouse name: Not on file   Number of children: Not on file   Years of education: Not on file   Highest education level: Bachelor's degree (e.g., BA, AB, BS)  Occupational History   Not on file  Tobacco Use   Smoking status: Former    Current packs/day: 0.30    Average packs/day: 0.3 packs/day for 30.0 years (9.0 ttl pk-yrs)    Types: Cigarettes   Smokeless tobacco: Never  Substance and Sexual Activity   Alcohol use: Not on file    Comment:  social drinker; now not at all    Drug use: Never   Sexual activity: Not on file  Other Topics Concern   Not on file  Social History Narrative   Live alone;    Lives in single level home; one level    Moved here due to retirement x 1 year ago   Brand new house   Walk in tub and oversized shower    Social Determinants of Health   Financial Resource Strain: Low Risk  (01/16/2023)   Overall Financial Resource Strain (CARDIA)    Difficulty of Paying Living Expenses: Not hard at all  Food Insecurity: No Food Insecurity (01/16/2023)   Hunger Vital Sign    Worried About Running Out of Food in the Last Year: Never true    Ran Out of Food in the Last Year: Never true  Transportation Needs: No Transportation Needs (01/16/2023)   PRAPARE - Administrator, Civil Service (Medical): No    Lack of Transportation (Non-Medical): No  Physical Activity: Unknown (01/16/2023)   Exercise Vital Sign    Days of Exercise per Week: 0 days  Minutes of Exercise per Session: Not on file  Stress: No Stress Concern Present (01/16/2023)   [employer] of Occupational Health - Occupational Stress Questionnaire    Feeling of Stress : Not at all  Social Connections: Moderately Integrated (01/16/2023)   Social Connection and Isolation Panel [NHANES]    Frequency of Communication with Friends and Family: More than three times a week    Frequency of Social Gatherings with Friends and Family: More than three times a week    Attends Religious Services: More than 4 times per year    Active Member of [employer] or Organizations: Yes    Attends Engineer, structural: More than 4 times per year    Marital Status: Divorced     Family History: The patient's family history includes Arthritis in her father and mother; Hyperlipidemia in her father and mother; Hypertension in her father and mother; Stroke in her father and mother. There is no history of Breast cancer.  ROS:   Please see the history of  present illness.    All other systems reviewed and are negative.  EKGs/Labs/Other Studies Reviewed:    The following studies were reviewed today:  EKG reveals atrial fibrillation and rightward axis and nonspecific ST-T changes.  Recent Labs: 01/16/2023: ALT 19; BUN 14; Creatinine, Ser 0.81; Hemoglobin 12.7; Platelets 296.0; Potassium 3.7; Sodium 140  Recent Lipid Panel    Component Value Date/Time   CHOL 178 01/16/2023 1202   CHOL 203 (H) 12/22/2021 1056   TRIG 78.0 01/16/2023 1202   HDL 66.10 01/16/2023 1202   HDL 66 12/22/2021 1056   CHOLHDL 3 01/16/2023 1202   VLDL 15.6 01/16/2023 1202   LDLCALC 96 01/16/2023 1202   LDLCALC 120 (H) 12/22/2021 1056    Physical Exam:    VS:  BP 122/82   Pulse 82   Ht 5\' 2"  (1.575 m)   Wt (!) 306 lb 1.9 oz (138.9 kg)   SpO2 98%   BMI 55.99 kg/m     Wt Readings from Last 3 Encounters:  04/04/23 (!) 306 lb 1.9 oz (138.9 kg)  02/19/23 296 lb 4.8 oz (134.4 kg)  01/16/23 298 lb 9.6 oz (135.4 kg)     GEN: Patient is in no acute distress HEENT: Normal NECK: No JVD; No carotid bruits LYMPHATICS: No lymphadenopathy CARDIAC: Hear sounds regular, 2/6 systolic murmur at the apex. RESPIRATORY:  Clear to auscultation without rales, wheezing or rhonchi  ABDOMEN: Soft, non-tender, non-distended MUSCULOSKELETAL:  No edema; No deformity  SKIN: Warm and dry NEUROLOGIC:  Alert and oriented x 3 PSYCHIATRIC:  Normal affect   Signed, Tina Brothers, MD  04/04/2023 2:14 PM    [facility]

## 2023-04-16 DIAGNOSIS — H25813 Combined forms of age-related cataract, bilateral: Secondary | ICD-10-CM | POA: Diagnosis not present

## 2023-04-16 DIAGNOSIS — H40013 Open angle with borderline findings, low risk, bilateral: Secondary | ICD-10-CM | POA: Diagnosis not present

## 2023-04-23 ENCOUNTER — Other Ambulatory Visit: Payer: Self-pay | Admitting: Internal Medicine

## 2023-04-23 DIAGNOSIS — I10 Essential (primary) hypertension: Secondary | ICD-10-CM

## 2023-05-04 DIAGNOSIS — H25812 Combined forms of age-related cataract, left eye: Secondary | ICD-10-CM | POA: Diagnosis not present

## 2023-05-23 ENCOUNTER — Other Ambulatory Visit: Payer: Self-pay | Admitting: Internal Medicine

## 2023-05-23 ENCOUNTER — Other Ambulatory Visit: Payer: Self-pay | Admitting: Cardiology

## 2023-05-23 DIAGNOSIS — E782 Mixed hyperlipidemia: Secondary | ICD-10-CM

## 2023-05-23 DIAGNOSIS — I480 Paroxysmal atrial fibrillation: Secondary | ICD-10-CM

## 2023-05-23 NOTE — Telephone Encounter (Signed)
Prescription refill request for Xarelto received.  Indication: PAF Last office visit: 04/04/23  R Revankar MD Weight: 138.9kg Age: 72 Scr: 0.81 on 01/16/23  Epic CrCl: 137.66  Based on above findings Xarelto 20mg  daily is the appropriate dose.  Refill approved.

## 2023-06-07 DIAGNOSIS — H2511 Age-related nuclear cataract, right eye: Secondary | ICD-10-CM | POA: Diagnosis not present

## 2023-06-12 DIAGNOSIS — H25811 Combined forms of age-related cataract, right eye: Secondary | ICD-10-CM | POA: Diagnosis not present

## 2023-08-12 ENCOUNTER — Other Ambulatory Visit: Payer: Self-pay | Admitting: Internal Medicine

## 2023-08-12 DIAGNOSIS — R600 Localized edema: Secondary | ICD-10-CM

## 2023-08-20 ENCOUNTER — Encounter: Payer: Medicare Other | Admitting: Internal Medicine

## 2023-08-30 ENCOUNTER — Ambulatory Visit: Payer: Medicare Other | Admitting: Internal Medicine

## 2023-08-30 ENCOUNTER — Encounter: Payer: Self-pay | Admitting: Internal Medicine

## 2023-08-30 VITALS — BP 120/70 | HR 73 | Temp 98.3°F | Ht 62.0 in | Wt 303.8 lb

## 2023-08-30 DIAGNOSIS — Z1231 Encounter for screening mammogram for malignant neoplasm of breast: Secondary | ICD-10-CM

## 2023-08-30 DIAGNOSIS — Z0000 Encounter for general adult medical examination without abnormal findings: Secondary | ICD-10-CM

## 2023-08-30 DIAGNOSIS — Z780 Asymptomatic menopausal state: Secondary | ICD-10-CM

## 2023-08-30 DIAGNOSIS — Z124 Encounter for screening for malignant neoplasm of cervix: Secondary | ICD-10-CM

## 2023-08-30 NOTE — Progress Notes (Signed)
 Established Patient Office Visit     CC/Reason for Visit: Subsequent Medicare wellness visit  HPI: Tina Villanueva is a 73 y.o. female who is coming in today for the above mentioned reasons. Past Medical History is significant for: Morbid obesity, atrial fibrillation, chronic venous insufficiency, impaired glucose tolerance, hypertension and hyperlipidemia.  Feels well without acute concerns or complaints.  Has routine eye and dental care.  Declines all vaccines despite counseling.  Is due for breast and cervical cancer screening, had a colonoscopy in 2022, is due for a bone density.   Past Medical/Surgical History: Past Medical History:  Diagnosis Date   Arthritis    Atrial bigeminy 02/25/2021   Benign essential hypertension 05/02/2016   Formatting of this note might be different from the original. Last Assessment & Plan:  BP adequately controlled. No changes in current management. Continue low-salt diet.   Bilateral lower extremity edema 03/06/2017   Formatting of this note might be different from the original. Last Assessment & Plan:  Information about vein specialist given. Recommend compression stocking more often,HCTZ 25 mg added. Wt loss may also help.   Bilateral primary osteoarthritis of knee 05/02/2016   BMI 45.0-49.9, adult (HCC) 05/02/2016   Formatting of this note might be different from the original. Last Assessment & Plan:  She has lost some weight since starting dietary changes. Encouraged to continue a healthful diet and to engage in low impact physical activity. Walking in place is a good option while she is not able to go to the gym.   Chicken pox    Coronary artery disease 03/30/2021   Dyslipidemia 09/27/2018   Gastroesophageal reflux disease without esophagitis 09/27/2018   H/O laparoscopic adjustable gastric banding 09/27/2018   Heart murmur    Hyperlipidemia    Hypertension    IFG (impaired fasting glucose) 09/05/2016   Morbid obesity with BMI of 50.0-59.9,  adult (HCC) 05/02/2016   Neuropathy, peripheral 08/09/2017   Formatting of this note might be different from the original. Last Assessment & Plan:  Problem improved with nonpharmacologic treatment. Continue following with chiropractor. Good skin care and feet check periodically.   OSA (obstructive sleep apnea) 05/02/2016   Pain in left knee 09/13/2018   Palpitations 02/25/2021   Paroxysmal atrial fibrillation (HCC) 03/30/2021   Positive fecal occult blood test    Rectal bleeding     Past Surgical History:  Procedure Laterality Date   BIOPSY  06/09/2021   Procedure: BIOPSY;  Surgeon: Federico Rosario BROCKS, MD;  Location: [facility];  Service: Gastroenterology;;   BREAST SURGERY     COLONOSCOPY WITH PROPOFOL  N/A 06/09/2021   Procedure: COLONOSCOPY WITH PROPOFOL ;  Surgeon: Federico Rosario BROCKS, MD;  Location: [facility];  Service: Gastroenterology;  Laterality: N/A;   POLYPECTOMY  06/09/2021   Procedure: POLYPECTOMY;  Surgeon: Federico Rosario BROCKS, MD;  Location: [facility];  Service: Gastroenterology;;   TONSILLECTOMY AND ADENOIDECTOMY      Social History:  reports that she has quit smoking. Her smoking use included cigarettes. She has a 9 pack-year smoking history. She has never used smokeless tobacco. She reports current alcohol use. She reports that she does not use drugs.  Allergies: No Known Allergies  Family History:  Family History  Problem Relation Age of Onset   Arthritis Mother    Hyperlipidemia Mother    Hypertension Mother    Stroke Mother    Arthritis Father    Hyperlipidemia Father    Hypertension Father    Stroke Father  Breast cancer Neg Hx      Current Outpatient Medications:    amLODipine  (NORVASC ) 10 MG tablet, TAKE 1 TABLET BY MOUTH DAILY, Disp: 90 tablet, Rfl: 1   atorvastatin  (LIPITOR) 40 MG tablet, TAKE 1 TABLET BY MOUTH DAILY, Disp: 90 tablet, Rfl: 1   Coenzyme Q10 ([employer] COQ-10) 200 MG capsule, Take 1 capsule (200 mg total) by mouth daily., Disp: 90 capsule,  Rfl: 3   furosemide  (LASIX ) 20 MG tablet, TAKE 1 TABLET BY MOUTH DAILY, Disp: 90 tablet, Rfl: 0   losartan -hydrochlorothiazide  (HYZAAR) 100-25 MG tablet, TAKE 1 TABLET BY MOUTH DAILY, Disp: 90 tablet, Rfl: 1   OVER THE COUNTER MEDICATION, Apply 1 application topically at bedtime as needed (leg pain). Magnesium calming cream, Disp: , Rfl:    rivaroxaban  (XARELTO ) 20 MG TABS tablet, TAKE 1 TABLET BY MOUTH ONCE DAILY WITH SUPPER, Disp: 90 tablet, Rfl: 1  Review of Systems:  Negative unless indicated in HPI.   Physical Exam: Vitals:   08/30/23 1033  BP: 120/70  Pulse: 73  Temp: 98.3 F (36.8 C)  TempSrc: Oral  SpO2: 96%  Weight: (!) 303 lb 12.8 oz (137.8 kg)  Height: 5' 2 (1.575 m)    Body mass index is 55.57 kg/m.   Physical Exam Vitals reviewed.  Constitutional:      General: She is not in acute distress.    Appearance: Normal appearance. She is obese. She is not ill-appearing, toxic-appearing or diaphoretic.  HENT:     Head: Normocephalic.     Right Ear: Tympanic membrane, ear canal and external ear normal. There is no impacted cerumen.     Left Ear: Tympanic membrane, ear canal and external ear normal. There is no impacted cerumen.     Nose: Nose normal.     Mouth/Throat:     Mouth: Mucous membranes are moist.     Pharynx: Oropharynx is clear. No oropharyngeal exudate or posterior oropharyngeal erythema.  Eyes:     General: No scleral icterus.       Right eye: No discharge.        Left eye: No discharge.     Conjunctiva/sclera: Conjunctivae normal.     Pupils: Pupils are equal, round, and reactive to light.  Neck:     Vascular: No carotid bruit.  Cardiovascular:     Rate and Rhythm: Normal rate and regular rhythm.     Pulses: Normal pulses.     Heart sounds: Normal heart sounds.  Pulmonary:     Effort: Pulmonary effort is normal. No respiratory distress.     Breath sounds: Normal breath sounds.  Abdominal:     General: Abdomen is flat. Bowel sounds are normal.      Palpations: Abdomen is soft.  Musculoskeletal:        General: Normal range of motion.     Cervical back: Normal range of motion.  Skin:    General: Skin is warm and dry.  Neurological:     General: No focal deficit present.     Mental Status: She is alert and oriented to person, place, and time. Mental status is at baseline.  Psychiatric:        Mood and Affect: Mood normal.        Behavior: Behavior normal.        Thought Content: Thought content normal.        Judgment: Judgment normal.    Subsequent Medicare wellness visit   1. Risk factors, based on past  M,S,F -  2.  Physical activities: Dietary issues and exercise activities discussed:      3.  Depression/mood:  Flowsheet Row Office Visit from 02/19/2023 in [facility] Total Score 0        4.  ADL's:    08/30/2023   10:21 AM 08/27/2023    8:10 AM  In your present state of health, do you have any difficulty performing the following activities:  Hearing? 0 0  Vision? 0 0  Difficulty concentrating or making decisions? 0 0  Walking or climbing stairs?  1  Dressing or bathing?  0  Doing errands, shopping?  0  Preparing Food and eating ?  N  Using the Toilet?  N  In the past six months, have you accidently leaked urine?  N  Do you have problems with loss of bowel control?  N  Managing your Medications?  N  Managing your Finances?  N  Housekeeping or managing your Housekeeping?  N     5.  Fall risk:     01/16/2023   10:44 AM 01/16/2023   11:40 AM 02/19/2023   11:02 AM 08/27/2023    8:10 AM 08/30/2023   10:29 AM  Fall Risk  Falls in the past year? 0 0 0 0 0  Was there an injury with Fall?  0 0 0 0  Fall Risk Category Calculator  0 0 0  0  Fall risk Follow up  Falls evaluation completed Falls evaluation completed  Falls evaluation completed     Patient-reported     6.  Home safety: No problems identified   7.  Height weight, and visual acuity: height and  weight as above, vision/hearing: Vision Screening   Right eye Left eye Both eyes  Without correction 20/20 20/20 20/20   With correction        8.  Counseling: Counseling given: Not Answered    9. Lab orders based on risk factors: Laboratory update will be reviewed   10. Cognitive assessment:        08/30/2023   10:30 AM  6CIT Screen  What Year? 0 points  What month? 0 points  What time? 0 points  Count back from 20 0 points  Months in reverse 0 points  Repeat phrase 2 points  Total Score 2 points     11. Screening: Patient provided with a written and personalized 5-10 year screening schedule in the AVS. Health Maintenance  Topic Date Due   DTaP/Tdap/Td vaccine (1 - Tdap) Never done   Pneumonia Vaccine (2 of 2 - PPSV23 or PCV20) 06/27/2016   Zoster (Shingles) Vaccine (2 of 2) 01/14/2018   Mammogram  07/24/2020   Flu Shot  03/29/2023   Medicare Annual Wellness Visit  08/29/2024   Colon Cancer Screening  06/10/2031   DEXA scan (bone density measurement)  Completed   Hepatitis C Screening  Completed   HPV Vaccine  Aged Out   COVID-19 Vaccine  Discontinued    12. Provider List Update: Patient Care Team    Relationship Specialty Notifications Start End  Theophilus Andrews, Tully GRADE, MD PCP - General Internal Medicine  04/27/21   Revankar, Jennifer SAUNDERS, MD PCP - Cardiology Cardiology  09/08/22      13. Advance Directives: Does Patient Have a Medical Advance Directive?: No Would patient like information on creating a medical advance directive?: No - Patient declined  14. Opioids: Patient is not on any opioid prescriptions and has no risk factors for  a substance use disorder.   15.   Goals      patient     Get back [facility] [employer] and start back in [employer] yoga          I have personally reviewed and noted the following in the patient's chart:   Medical and social history Use of alcohol, tobacco or illicit drugs  Current medications and  supplements Functional ability and status Nutritional status Physical activity Advanced directives List of other physicians Hospitalizations, surgeries, and ER visits in previous 12 months Vitals Screenings to include cognitive, depression, and falls Referrals and appointments  In addition, I have reviewed and discussed with patient certain preventive protocols, quality metrics, and best practice recommendations. A written personalized care plan for preventive services as well as general preventive health recommendations were provided to patient.   Impression and Plan:  Encounter for Medicare annual wellness exam  Screening mammogram for breast cancer -     3D Screening Mammogram, Left and Right; Future  Screening for cervical cancer -     Ambulatory referral to Gynecology  Postmenopausal estrogen deficiency -     DG Bone Density; Future   -Recommend routine eye and dental care. -Healthy lifestyle discussed in detail. -Labs to be updated today. -Prostate cancer screening: N/A Health Maintenance  Topic Date Due   DTaP/Tdap/Td vaccine (1 - Tdap) Never done   Pneumonia Vaccine (2 of 2 - PPSV23 or PCV20) 06/27/2016   Zoster (Shingles) Vaccine (2 of 2) 01/14/2018   Mammogram  07/24/2020   Flu Shot  03/29/2023   Medicare Annual Wellness Visit  08/29/2024   Colon Cancer Screening  06/10/2031   DEXA scan (bone density measurement)  Completed   Hepatitis C Screening  Completed   HPV Vaccine  Aged Out   COVID-19 Vaccine  Discontinued    -Declines all immunizations. -Referrals placed for mammogram, DEXA and GYN exam.    Jozeph Persing Theophilus Andrews, MD [facility]

## 2023-09-24 ENCOUNTER — Other Ambulatory Visit: Payer: Self-pay | Admitting: Internal Medicine

## 2023-09-24 DIAGNOSIS — I10 Essential (primary) hypertension: Secondary | ICD-10-CM

## 2023-10-22 ENCOUNTER — Encounter: Payer: Self-pay | Admitting: Family Medicine

## 2023-10-22 ENCOUNTER — Ambulatory Visit: Payer: Medicare Other | Admitting: Family Medicine

## 2023-10-22 VITALS — BP 146/85 | HR 91 | Ht 62.0 in | Wt 307.0 lb

## 2023-10-22 DIAGNOSIS — Z01419 Encounter for gynecological examination (general) (routine) without abnormal findings: Secondary | ICD-10-CM

## 2023-10-22 DIAGNOSIS — Z0000 Encounter for general adult medical examination without abnormal findings: Secondary | ICD-10-CM

## 2023-10-22 NOTE — Progress Notes (Signed)
 ANNUAL EXAM Patient name: Tina Villanueva MRN [id number]  Date of birth: [birth year] Chief Complaint:   Establish Care  History of Present Illness:   Tina Villanueva is a 73 y.o. No obstetric history on file. female being seen today for a routine annual exam.  Current complaints: None  No LMP recorded. Patient is postmenopausal.   The pregnancy intention screening data noted above was reviewed. Potential methods of contraception were discussed. The patient elected to proceed with No data recorded.   Last pap 2017. Results were:  normal per pt report . H/O abnormal pap: no Last mammogram: 2023. Results were: normal. Has had fibrous tissue removed in b/l breasts -- left was 30 yrs ago, right was 20 yrs ago. Family h/o breast cancer: no Last colonoscopy: 2022. Results were: normal. Family h/o colorectal cancer: no     08/30/2023   10:25 AM 02/19/2023   11:02 AM 01/16/2023   11:40 AM 08/16/2021   11:33 AM 01/18/2021   11:01 AM  Depression screen PHQ 2/9  Decreased Interest 0 0 0 0 0  Down, Depressed, Hopeless 0 0 0 0 0  PHQ - 2 Score 0 0 0 0 0  Altered sleeping  0 0 0   Tired, decreased energy  0 0 0   Change in appetite  0 0 0   Feeling bad or failure about yourself   0 0 0   Trouble concentrating  0 0 0   Moving slowly or fidgety/restless  0 0 0   Suicidal thoughts  0 0 0   PHQ-9 Score  0 0 0   Difficult doing work/chores  Not difficult at all Not difficult at all Not difficult at all         02/19/2023   11:03 AM  GAD 7 : Generalized Anxiety Score  Nervous, Anxious, on Edge 0  Control/stop worrying 0  Worry too much - different things 0  Trouble relaxing 0  Restless 0  Easily annoyed or irritable 0  Afraid - awful might happen 0  Total GAD 7 Score 0  Anxiety Difficulty Not difficult at all     Review of Systems:   Pertinent items are noted in HPI Denies any headaches, blurred vision, fatigue, shortness of breath, chest pain, abdominal pain, abnormal vaginal  discharge/itching/odor/irritation, problems with periods, bowel movements, urination, or intercourse unless otherwise stated above. Pertinent History Reviewed:  Reviewed past medical,surgical, social and family history.  Reviewed problem list, medications and allergies. Physical Assessment:   Vitals:   10/22/23 1309  BP: (!) 146/85  Pulse: 91  Weight: (!) 307 lb (139.3 kg)  Height: 5\' 2"  (1.575 m)  Body mass index is 56.15 kg/m.        Physical Examination:   General appearance - well appearing, and in no distress  Mental status - alert, oriented to person, place, and time  Psych:  She has a normal mood and affect  Skin - warm and dry, normal color, no suspicious lesions noted  Chest - effort normal, all lung fields clear to auscultation bilaterally  Heart - normal rate and regular rhythm  Neck:  midline trachea, no thyromegaly or nodules  Breasts - breasts appear normal, no suspicious masses, no skin or nipple changes or  axillary nodes  Abdomen - soft, nontender, nondistended, no masses or organomegaly  Pelvic - VULVA: normal appearing vulva with no masses, tenderness or lesions  VAGINA: normal appearing vagina with normal color and discharge, no lesions  CERVIX: normal  appearing cervix without discharge or lesions, no CMT  Thin prep pap is not done   Extremities:  No swelling or varicosities noted  Chaperone present for exam  No results found for this or any previous visit (from the past 24 hours).  Assessment & Plan:  1) Well-Woman Exam  - Pt scheduled for mammogram and DEXA scan  - Colonoscopy up to date  - Discussed that pt no longer requires pap test based on age  - F/up one year for annual physical  Labs/procedures today: None  Mammogram: scheduled Colonoscopy: per GI, or sooner if problems  No orders of the defined types were placed in this encounter.   Meds: No orders of the defined types were placed in this encounter.   Follow-up: Return in about 1 year  (around 10/21/2024) for Yearly wellness.  Sundra Aland, MD 10/22/2023 1:42 PM

## 2023-10-22 NOTE — Progress Notes (Signed)
 Referred by PCP. No c/o today.

## 2023-12-13 ENCOUNTER — Other Ambulatory Visit: Payer: Self-pay | Admitting: Cardiology

## 2023-12-13 DIAGNOSIS — I480 Paroxysmal atrial fibrillation: Secondary | ICD-10-CM

## 2023-12-13 NOTE — Telephone Encounter (Signed)
 Prescription refill request for Xarelto received.  Indication: Afib  Last office visit: 04/04/23 (Revankar)  Weight: 139.3kg Age: 73  Scr: 0.81 (01/16/23)  CrCl: 136.2ml/min  Appropriate dose. Refill sent.

## 2023-12-23 ENCOUNTER — Other Ambulatory Visit: Payer: Self-pay | Admitting: Internal Medicine

## 2023-12-23 DIAGNOSIS — E782 Mixed hyperlipidemia: Secondary | ICD-10-CM

## 2023-12-23 DIAGNOSIS — R600 Localized edema: Secondary | ICD-10-CM

## 2024-01-04 ENCOUNTER — Encounter: Payer: Self-pay | Admitting: Cardiology

## 2024-01-04 ENCOUNTER — Ambulatory Visit: Attending: Cardiology | Admitting: Cardiology

## 2024-01-04 VITALS — BP 136/74 | HR 82 | Ht 62.0 in | Wt 313.1 lb

## 2024-01-04 DIAGNOSIS — I498 Other specified cardiac arrhythmias: Secondary | ICD-10-CM

## 2024-01-04 DIAGNOSIS — I10 Essential (primary) hypertension: Secondary | ICD-10-CM

## 2024-01-04 DIAGNOSIS — G4733 Obstructive sleep apnea (adult) (pediatric): Secondary | ICD-10-CM | POA: Diagnosis not present

## 2024-01-04 DIAGNOSIS — Z6841 Body Mass Index (BMI) 40.0 and over, adult: Secondary | ICD-10-CM | POA: Diagnosis not present

## 2024-01-04 DIAGNOSIS — Z9884 Bariatric surgery status: Secondary | ICD-10-CM | POA: Diagnosis not present

## 2024-01-04 DIAGNOSIS — I480 Paroxysmal atrial fibrillation: Secondary | ICD-10-CM

## 2024-01-04 NOTE — Progress Notes (Signed)
 Cardiology Office Note:    Date:  01/04/2024   ID:  Tina Villanueva, DOB 1951/07/27, MRN [id number]  PCP:  Tina Villanueva, [facility]ueva  Cardiologist:  Tina Villanueva   Referring Villanueva: Tina Villanueva, Estel*    ASSESSMENT:    1. Benign essential hypertension   2. Atrial bigeminy   3. OSA (obstructive sleep apnea)   4. H/O laparoscopic adjustable gastric banding   5. Morbid obesity with BMI of 50.0-59.9, adult (HCC)   6. Paroxysmal atrial fibrillation (HCC)    PLAN:    In order of problems listed above:  Primary prevention stressed with the patient.  Importance of compliance with diet medication stressed and patient verbalized standing. Elevated calcium  score: I discussed this with the patient.  Lipids are followed by primary care.  Goal LDL must be less than 60.  Diet emphasized. Persistent atrial fibrillation:I discussed with the patient atrial fibrillation, disease process. Management and therapy including rate and rhythm control, anticoagulation benefits and potential risks were discussed extensively with the patient. Patient had multiple questions which were answered to patient's satisfaction. Essential hypertension: Blood pressure stable and diet was emphasized Mixed dyslipidemia: As mentioned above. Morbid obesity: Weight reduction stressed diet emphasized and she promises to do better. Patient will be seen in follow-up appointment in 12 months or earlier if the patient has any concerns.    Medication Adjustments/Labs and Tests Ordered: Current medicines are reviewed at length with the patient today.  Concerns regarding medicines are outlined above.  No orders of the defined types were placed in this encounter.  No orders of the defined types were placed in this encounter.    No chief complaint on file.    History of Present Illness:    Tina Villanueva is a 73 y.o. female.  Patient has past medical history of essential hypertension, coronary artery calcium   mixed dyslipidemia, diabetes mellitus and morbid obesity.  She has persistent atrial fibrillation.  She denies any problems at this time and takes care of activities of daily living.  No chest pain orthopnea or PND.  At the time of my evaluation, the patient is alert awake oriented and in no distress.  She leads a sedentary lifestyle and ambulates with a walker.  She has morbid obesity.  Past Medical History:  Diagnosis Date   Arthritis    Atrial bigeminy 02/25/2021   Benign essential hypertension 05/02/2016   Formatting of this note might be different from the original. Last Assessment & Plan:  BP adequately controlled. No changes in current management. Continue low-salt diet.   Bilateral lower extremity edema 03/06/2017   Formatting of this note might be different from the original. Last Assessment & Plan:  Information about vein specialist given. Recommend compression stocking more often,HCTZ 25 mg added. Wt loss may also help.   Bilateral primary osteoarthritis of knee 05/02/2016   BMI 45.0-49.9, adult (HCC) 05/02/2016   Formatting of this note might be different from the original. Last Assessment & Plan:  She has lost some weight since starting dietary changes. Encouraged to continue a healthful diet and to engage in low impact physical activity. Walking in place is a good option while she is not able to go to the gym.   Chicken pox    Coronary artery disease 03/30/2021   Dyslipidemia 09/27/2018   Gastroesophageal reflux disease without esophagitis 09/27/2018   H/O laparoscopic adjustable gastric banding 09/27/2018   Heart murmur    Hyperlipidemia    Hypertension  IFG (impaired fasting glucose) 09/05/2016   Morbid obesity with BMI of 50.0-59.9, adult (HCC) 05/02/2016   Neuropathy, peripheral 08/09/2017   Formatting of this note might be different from the original. Last Assessment & Plan:  Problem improved with nonpharmacologic treatment. Continue following with chiropractor. Good skin  care and feet check periodically.   OSA (obstructive sleep apnea) 05/02/2016   Pain in left knee 09/13/2018   Palpitations 02/25/2021   Paroxysmal atrial fibrillation (HCC) 03/30/2021   Positive fecal occult blood test    Rectal bleeding     Past Surgical History:  Procedure Laterality Date   BIOPSY  06/09/2021   Procedure: BIOPSY;  Surgeon: [facility];  [facility];  Service: Gastroenterology;;   BREAST SURGERY     COLONOSCOPY WITH PROPOFOL  N/A 06/09/2021   Procedure: COLONOSCOPY WITH PROPOFOL ;  Surgeon: [facility];  [facility];  Service: Gastroenterology;  Laterality: N/A;   DG KNEE RIGHT COMPLETE ([facility] HX)     Titanium replacement   POLYPECTOMY  06/09/2021   Procedure: POLYPECTOMY;  Surgeon: [facility] Villanueva;  Location: [facility];  Service: Gastroenterology;;   TONSILLECTOMY AND ADENOIDECTOMY      Current Medications: Current Meds  Medication Sig   amLODipine  (NORVASC ) 10 MG tablet TAKE 1 TABLET BY MOUTH DAILY   atorvastatin  (LIPITOR) 40 MG tablet TAKE 1 TABLET BY MOUTH DAILY   Coenzyme Q10 ([facility] COQ-10) 200 MG capsule Take 1 capsule (200 mg total) by mouth daily.   furosemide  (LASIX ) 20 MG tablet TAKE 1 TABLET BY MOUTH DAILY   losartan -hydrochlorothiazide  (HYZAAR) 100-25 MG tablet TAKE 1 TABLET BY MOUTH DAILY   OVER THE COUNTER MEDICATION Apply 1 application topically at bedtime as needed (leg pain). Magnesium calming cream   rivaroxaban  (XARELTO ) 20 MG TABS tablet TAKE 1 TABLET BY MOUTH ONCE DAILY WITH SUPPER     Allergies:   Patient has no known allergies.   Social History   Socioeconomic History   Marital status: Single    Spouse name: Not on file   Number of children: Not on file   Years of education: Not on file   Highest education level: Bachelor's degree (e.g., BA, AB, BS)  Occupational History   Not on file  Tobacco Use   Smoking status: Former    Current packs/day: 0.30    Average packs/day: 0.3 packs/day for 30.0  years (9.0 ttl pk-yrs)    Types: Cigarettes   Smokeless tobacco: Never  Vaping Use   Vaping status: Never Used  Substance and Sexual Activity   Alcohol use: Yes    Comment: social drinker; now not at all    Drug use: Never   Sexual activity: Not Currently  Other Topics Concern   Not on file  Social History Narrative   Live alone;    Lives in single level home; one level    Moved here due to retirement x 1 year ago   Brand new house   Walk in tub and oversized shower    Social Drivers of Health   Financial Resource Strain: Low Risk  (08/30/2023)   Overall Financial Resource Strain (CARDIA)    Difficulty of Paying Living Expenses: Not hard at all  Food Insecurity: No Food Insecurity (08/30/2023)   Hunger Vital Sign    Worried About Running Out of Food in the Last Year: Never true    Ran Out of Food in the Last Year: Never true  Transportation Needs: No Transportation Needs (08/30/2023)  PRAPARE - Administrator, Civil Service (Medical): No    Lack of Transportation (Non-Medical): No  Physical Activity: Inactive (08/30/2023)   Exercise Vital Sign    Days of Exercise per Week: 0 days    Minutes of Exercise per Session: 0 min  Stress: No Stress Concern Present (08/30/2023)   [employer] of Occupational Health - Occupational Stress Questionnaire    Feeling of Stress : Not at all  Social Connections: Moderately Integrated (08/30/2023)   Social Connection and Isolation Panel [NHANES]    Frequency of Communication with Friends and Family: More than three times a week    Frequency of Social Gatherings with Friends and Family: More than three times a week    Attends Religious Services: More than 4 times per year    Active Member of [employer] or Organizations: Yes    Attends Engineer, structural: More than 4 times per year    Marital Status: Divorced     Family History: The patient's family history includes Arthritis in her father and mother; Hyperlipidemia in her  father and mother; Hypertension in her father and mother; Stroke in her father and mother. There is no history of Breast cancer.  ROS:   Please see the history of present illness.    All other systems reviewed and are negative.  EKGs/Labs/Other Studies Reviewed:    The following studies were reviewed today: I discussed my findings with the patient at length   Recent Labs: 01/16/2023: ALT 19; BUN 14; Creatinine, Ser 0.81; Hemoglobin 12.7; Platelets 296.0; Potassium 3.7; Sodium 140  Recent Lipid Panel    Component Value Date/Time   CHOL 178 01/16/2023 1202   CHOL 203 (H) 12/22/2021 1056   TRIG 78.0 01/16/2023 1202   HDL 66.10 01/16/2023 1202   HDL 66 12/22/2021 1056   CHOLHDL 3 01/16/2023 1202   VLDL 15.6 01/16/2023 1202   LDLCALC 96 01/16/2023 1202   LDLCALC 120 (H) 12/22/2021 1056    Physical Exam:    VS:  BP 136/74   Pulse 82   Ht 5\' 2"  (1.575 m)   Wt (!) 313 lb 1.3 oz (142 kg)   SpO2 94%   BMI 57.26 kg/m     Wt Readings from Last 3 Encounters:  01/04/24 (!) 313 lb 1.3 oz (142 kg)  10/22/23 (!) 307 lb (139.3 kg)  08/30/23 (!) 303 lb 12.8 oz (137.8 kg)     GEN: Patient is in no acute distress HEENT: Normal NECK: No JVD; No carotid bruits LYMPHATICS: No lymphadenopathy CARDIAC: Hear sounds regular, 2/6 systolic murmur at the apex. RESPIRATORY:  Clear to auscultation without rales, wheezing or rhonchi  ABDOMEN: Soft, non-tender, non-distended MUSCULOSKELETAL:  No edema; No deformity  SKIN: Warm and dry NEUROLOGIC:  Alert and oriented x 3 PSYCHIATRIC:  Normal affect   Signed, Tina Villanueva  01/04/2024 2:25 PM    [facility]

## 2024-01-04 NOTE — Patient Instructions (Signed)

## 2024-01-22 DIAGNOSIS — Z961 Presence of intraocular lens: Secondary | ICD-10-CM | POA: Diagnosis not present

## 2024-01-22 DIAGNOSIS — H40013 Open angle with borderline findings, low risk, bilateral: Secondary | ICD-10-CM | POA: Diagnosis not present

## 2024-02-05 ENCOUNTER — Encounter: Payer: Self-pay | Admitting: Internal Medicine

## 2024-02-05 DIAGNOSIS — Z780 Asymptomatic menopausal state: Secondary | ICD-10-CM

## 2024-02-27 ENCOUNTER — Ambulatory Visit (INDEPENDENT_AMBULATORY_CARE_PROVIDER_SITE_OTHER): Payer: Medicare Other | Admitting: Internal Medicine

## 2024-02-27 ENCOUNTER — Encounter: Payer: Self-pay | Admitting: Internal Medicine

## 2024-02-27 VITALS — BP 118/73 | HR 67 | Temp 97.6°F | Wt 316.3 lb

## 2024-02-27 DIAGNOSIS — G4733 Obstructive sleep apnea (adult) (pediatric): Secondary | ICD-10-CM

## 2024-02-27 DIAGNOSIS — E782 Mixed hyperlipidemia: Secondary | ICD-10-CM | POA: Diagnosis not present

## 2024-02-27 DIAGNOSIS — Z6841 Body Mass Index (BMI) 40.0 and over, adult: Secondary | ICD-10-CM

## 2024-02-27 DIAGNOSIS — I480 Paroxysmal atrial fibrillation: Secondary | ICD-10-CM

## 2024-02-27 DIAGNOSIS — I10 Essential (primary) hypertension: Secondary | ICD-10-CM

## 2024-02-27 DIAGNOSIS — R7301 Impaired fasting glucose: Secondary | ICD-10-CM

## 2024-02-27 LAB — CBC WITH DIFFERENTIAL/PLATELET
Basophils Absolute: 0 10*3/uL (ref 0.0–0.1)
Basophils Relative: 0.4 % (ref 0.0–3.0)
Eosinophils Absolute: 0.2 10*3/uL (ref 0.0–0.7)
Eosinophils Relative: 2.7 % (ref 0.0–5.0)
HCT: 39.7 % (ref 36.0–46.0)
Hemoglobin: 13 g/dL (ref 12.0–15.0)
Lymphocytes Relative: 22.9 % (ref 12.0–46.0)
Lymphs Abs: 1.5 10*3/uL (ref 0.7–4.0)
MCHC: 32.7 g/dL (ref 30.0–36.0)
MCV: 87.9 fl (ref 78.0–100.0)
Monocytes Absolute: 0.5 10*3/uL (ref 0.1–1.0)
Monocytes Relative: 7.8 % (ref 3.0–12.0)
Neutro Abs: 4.3 10*3/uL (ref 1.4–7.7)
Neutrophils Relative %: 66.2 % (ref 43.0–77.0)
Platelets: 278 10*3/uL (ref 150.0–400.0)
RBC: 4.51 Mil/uL (ref 3.87–5.11)
RDW: 15.2 % (ref 11.5–15.5)
WBC: 6.6 10*3/uL (ref 4.0–10.5)

## 2024-02-27 LAB — COMPREHENSIVE METABOLIC PANEL WITH GFR
ALT: 17 U/L (ref 0–35)
AST: 17 U/L (ref 0–37)
Albumin: 4.5 g/dL (ref 3.5–5.2)
Alkaline Phosphatase: 102 U/L (ref 39–117)
BUN: 13 mg/dL (ref 6–23)
CO2: 30 meq/L (ref 19–32)
Calcium: 9.7 mg/dL (ref 8.4–10.5)
Chloride: 99 meq/L (ref 96–112)
Creatinine, Ser: 0.79 mg/dL (ref 0.40–1.20)
GFR: 74.28 mL/min (ref >60.00)
Glucose, Bld: 114 mg/dL — ABNORMAL HIGH (ref 70–99)
Potassium: 3.5 meq/L (ref 3.5–5.1)
Sodium: 137 meq/L (ref 135–145)
Total Bilirubin: 1.1 mg/dL (ref 0.2–1.2)
Total Protein: 8 g/dL (ref 6.0–8.3)

## 2024-02-27 LAB — POCT GLYCOSYLATED HEMOGLOBIN (HGB A1C): Hemoglobin A1C: 6.1 % — AB (ref 4.0–5.6)

## 2024-02-27 LAB — LIPID PANEL
Cholesterol: 174 mg/dL (ref 0–200)
HDL: 59.4 mg/dL (ref >39.00)
LDL Cholesterol: 95 mg/dL (ref 0–99)
NonHDL: 114.28
Total CHOL/HDL Ratio: 3
Triglycerides: 96 mg/dL (ref 0.0–149.0)
VLDL: 19.2 mg/dL (ref 0.0–40.0)

## 2024-02-27 NOTE — Progress Notes (Signed)
 Established Patient Office Visit     CC/Reason for Visit: 68-month follow-up  HPI: Tina Villanueva is a 73 y.o. female who is coming in today for the above mentioned reasons. Past Medical History is significant for: Morbid obesity, A-fib followed by cardiology, chronic venous insufficiency, impaired glucose tolerance, hypertension and hyperlipidemia.  She is feeling well.  Has noticed some increased lower extremity edema.  Is wearing compression stockings but has not been elevating legs at home.   Past Medical/Surgical History: Past Medical History:  Diagnosis Date   Arthritis    Atrial bigeminy 02/25/2021   Benign essential hypertension 05/02/2016   Formatting of this note might be different from the original. Last Assessment & Plan:  BP adequately controlled. No changes in current management. Continue low-salt diet.   Bilateral lower extremity edema 03/06/2017   Formatting of this note might be different from the original. Last Assessment & Plan:  Information about vein specialist given. Recommend compression stocking more often,HCTZ 25 mg added. Wt loss may also help.   Bilateral primary osteoarthritis of knee 05/02/2016   BMI 45.0-49.9, adult (HCC) 05/02/2016   Formatting of this note might be different from the original. Last Assessment & Plan:  She has lost some weight since starting dietary changes. Encouraged to continue a healthful diet and to engage in low impact physical activity. Walking in place is a good option while she is not able to go to the gym.   Chicken pox    Coronary artery disease 03/30/2021   Dyslipidemia 09/27/2018   Gastroesophageal reflux disease without esophagitis 09/27/2018   H/O laparoscopic adjustable gastric banding 09/27/2018   Heart murmur    Hyperlipidemia    Hypertension    IFG (impaired fasting glucose) 09/05/2016   Morbid obesity with BMI of 50.0-59.9, adult (HCC) 05/02/2016   Neuropathy, peripheral 08/09/2017   Formatting of this note  might be different from the original. Last Assessment & Plan:  Problem improved with nonpharmacologic treatment. Continue following with chiropractor. Good skin care and feet check periodically.   OSA (obstructive sleep apnea) 05/02/2016   Pain in left knee 09/13/2018   Palpitations 02/25/2021   Paroxysmal atrial fibrillation (HCC) 03/30/2021   Positive fecal occult blood test    Rectal bleeding     Past Surgical History:  Procedure Laterality Date   BIOPSY  06/09/2021   Procedure: BIOPSY;  Surgeon: Federico Rosario BROCKS, MD;  Location: [facility];  Service: Gastroenterology;;   BREAST SURGERY     COLONOSCOPY WITH PROPOFOL  N/A 06/09/2021   Procedure: COLONOSCOPY WITH PROPOFOL ;  Surgeon: Federico Rosario BROCKS, MD;  Location: [facility];  Service: Gastroenterology;  Laterality: N/A;   DG KNEE RIGHT COMPLETE ([facility] HX)     Titanium replacement   POLYPECTOMY  06/09/2021   Procedure: POLYPECTOMY;  Surgeon: Federico Rosario BROCKS, MD;  Location: [facility];  Service: Gastroenterology;;   TONSILLECTOMY AND ADENOIDECTOMY      Social History:  reports that she has quit smoking. Her smoking use included cigarettes. She has a 9 pack-year smoking history. She has never used smokeless tobacco. She reports current alcohol use. She reports that she does not use drugs.  Allergies: No Known Allergies  Family History:  Family History  Problem Relation Age of Onset   Arthritis Mother    Hyperlipidemia Mother    Hypertension Mother    Stroke Mother    Arthritis Father    Hyperlipidemia Father    Hypertension Father    Stroke Father  Breast cancer Neg Hx      Current Outpatient Medications:    amLODipine  (NORVASC ) 10 MG tablet, TAKE 1 TABLET BY MOUTH DAILY, Disp: 90 tablet, Rfl: 0   atorvastatin  (LIPITOR) 40 MG tablet, TAKE 1 TABLET BY MOUTH DAILY, Disp: 90 tablet, Rfl: 0   Coenzyme Q10 ([facility] COQ-10) 200 MG capsule, Take 1 capsule (200 mg total) by mouth daily., Disp: 90 capsule, Rfl: 3   furosemide  (LASIX )  20 MG tablet, TAKE 1 TABLET BY MOUTH DAILY, Disp: 90 tablet, Rfl: 0   losartan -hydrochlorothiazide  (HYZAAR) 100-25 MG tablet, TAKE 1 TABLET BY MOUTH DAILY, Disp: 90 tablet, Rfl: 1   OVER THE COUNTER MEDICATION, Apply 1 application topically at bedtime as needed (leg pain). Magnesium calming cream, Disp: , Rfl:    rivaroxaban  (XARELTO ) 20 MG TABS tablet, TAKE 1 TABLET BY MOUTH ONCE DAILY WITH SUPPER, Disp: 90 tablet, Rfl: 1  Review of Systems:  Negative unless indicated in HPI.   Physical Exam: Vitals:   02/27/24 1023 02/27/24 1027  BP: (!) 148/100 118/73  Pulse: 67   Temp: 97.6 F (36.4 C)   TempSrc: Oral   SpO2: 98%   Weight: (!) 316 lb 4.8 oz (143.5 kg)     Body mass index is 57.85 kg/m.   Physical Exam Vitals reviewed.  Constitutional:      Appearance: Normal appearance. She is obese.  HENT:     Head: Normocephalic and atraumatic.  Eyes:     Conjunctiva/sclera: Conjunctivae normal.  Cardiovascular:     Rate and Rhythm: Normal rate. Rhythm irregular.  Pulmonary:     Effort: Pulmonary effort is normal.     Breath sounds: Normal breath sounds.  Skin:    General: Skin is warm and dry.  Neurological:     General: No focal deficit present.     Mental Status: She is alert and oriented to person, place, and time.  Psychiatric:        Mood and Affect: Mood normal.        Behavior: Behavior normal.        Thought Content: Thought content normal.        Judgment: Judgment normal.      Impression and Plan:  IFG (impaired fasting glucose) -     POCT glycosylated hemoglobin (Hb A1C)  Morbid obesity with BMI of 50.0-59.9, adult (HCC)  OSA (obstructive sleep apnea)  Mixed hyperlipidemia -     Lipid panel; Future  Benign essential hypertension -     CBC with Differential/Platelet; Future -     Comprehensive metabolic panel with GFR; Future  Paroxysmal atrial fibrillation (HCC)   - Blood pressure is fairly well-controlled. - A1c remains stable in the  prediabetic range at 6.1. -Discussed healthy lifestyle, including increased physical activity and better food choices to promote weight loss. - Check lipids and LFTs today.  Time spent:31 minutes reviewing chart, interviewing and examining patient and formulating plan of care.     Tully Theophilus Andrews, MD [facility]

## 2024-02-28 ENCOUNTER — Ambulatory Visit: Payer: Self-pay | Admitting: Internal Medicine

## 2024-02-28 ENCOUNTER — Telehealth: Payer: Self-pay | Admitting: *Deleted

## 2024-02-28 DIAGNOSIS — E782 Mixed hyperlipidemia: Secondary | ICD-10-CM

## 2024-02-28 MED ORDER — ATORVASTATIN CALCIUM 80 MG PO TABS: 80.0000 mg | ORAL_TABLET | Freq: Every day | ORAL | 1 refills | Status: AC

## 2024-02-28 NOTE — Telephone Encounter (Signed)
 Reason for CRM: Patient called in regarding a missed call , relay results and informed her about increase on medication, patient stated she understood and had no further questions

## 2024-03-11 ENCOUNTER — Other Ambulatory Visit: Payer: Self-pay | Admitting: Internal Medicine

## 2024-03-11 DIAGNOSIS — R600 Localized edema: Secondary | ICD-10-CM

## 2024-03-11 DIAGNOSIS — E782 Mixed hyperlipidemia: Secondary | ICD-10-CM

## 2024-03-11 DIAGNOSIS — I10 Essential (primary) hypertension: Secondary | ICD-10-CM

## 2024-04-23 ENCOUNTER — Other Ambulatory Visit: Payer: Medicare Other

## 2024-04-23 ENCOUNTER — Ambulatory Visit
Admission: RE | Admit: 2024-04-23 | Discharge: 2024-04-23 | Disposition: A | Discharge status: Home / Self Care | Payer: Medicare Other | Source: Ambulatory Visit | Attending: Internal Medicine | Admitting: Internal Medicine

## 2024-04-23 DIAGNOSIS — Z1231 Encounter for screening mammogram for malignant neoplasm of breast: Secondary | ICD-10-CM

## 2024-04-30 ENCOUNTER — Other Ambulatory Visit: Payer: Self-pay | Admitting: Internal Medicine

## 2024-04-30 DIAGNOSIS — R928 Other abnormal and inconclusive findings on diagnostic imaging of breast: Secondary | ICD-10-CM

## 2024-05-06 ENCOUNTER — Ambulatory Visit

## 2024-05-06 ENCOUNTER — Ambulatory Visit
Admission: RE | Admit: 2024-05-06 | Discharge: 2024-05-06 | Disposition: A | Discharge status: Home / Self Care | Source: Ambulatory Visit | Attending: Internal Medicine | Admitting: Internal Medicine

## 2024-05-06 DIAGNOSIS — R92321 Mammographic fibroglandular density, right breast: Secondary | ICD-10-CM | POA: Diagnosis not present

## 2024-05-06 DIAGNOSIS — R928 Other abnormal and inconclusive findings on diagnostic imaging of breast: Secondary | ICD-10-CM

## 2024-06-05 ENCOUNTER — Other Ambulatory Visit: Payer: Self-pay | Admitting: Cardiology

## 2024-06-05 DIAGNOSIS — I480 Paroxysmal atrial fibrillation: Secondary | ICD-10-CM

## 2024-06-05 NOTE — Telephone Encounter (Signed)
 Prescription refill request for Xarelto  received.  Indication: afib  Last office visit: Revankar, 01/04/2024 Weight: 143.5 kg  Age: 73 yo  Scr: 0.79, 02/27/2024 CrCl: 57 ml/min   Refill sent.

## 2024-09-01 ENCOUNTER — Ambulatory Visit (INDEPENDENT_AMBULATORY_CARE_PROVIDER_SITE_OTHER): Admitting: Internal Medicine

## 2024-09-01 ENCOUNTER — Encounter: Payer: Self-pay | Admitting: Internal Medicine

## 2024-09-01 VITALS — BP 128/70 | HR 76 | Temp 97.6°F | Ht 61.0 in | Wt 320.9 lb

## 2024-09-01 DIAGNOSIS — Z780 Asymptomatic menopausal state: Secondary | ICD-10-CM | POA: Diagnosis not present

## 2024-09-01 DIAGNOSIS — Z0000 Encounter for general adult medical examination without abnormal findings: Secondary | ICD-10-CM

## 2024-09-01 DIAGNOSIS — I480 Paroxysmal atrial fibrillation: Secondary | ICD-10-CM | POA: Diagnosis not present

## 2024-09-01 DIAGNOSIS — Z6841 Body Mass Index (BMI) 40.0 and over, adult: Secondary | ICD-10-CM | POA: Diagnosis not present

## 2024-09-01 MED ORDER — TIRZEPATIDE-WEIGHT MANAGEMENT 2.5 MG/0.5ML ~~LOC~~ SOLN: 2.5000 mg | SUBCUTANEOUS | 0 refills | Status: DC

## 2024-09-01 NOTE — Progress Notes (Signed)
 "    Established Patient Office Visit     CC/Reason for Visit: Subsequent Medicare wellness visit, discuss obesity  HPI: Tina Villanueva is a 74 y.o. female who is coming in today for the above mentioned reasons. Past Medical History is significant for: Morbid obesity, atrial fibrillation, chronic venous insufficiency, hypertension, hyperlipidemia, impaired glucose tolerance.  She is due for bone density.  She is due for Tdap, pneumonia, flu, COVID vaccines.  She is very interested in starting a GLP-1 for weight loss purposes.   Past Medical/Surgical History: Past Medical History:  Diagnosis Date   Arthritis    Atrial bigeminy 02/25/2021   Benign essential hypertension 05/02/2016   Formatting of this note might be different from the original. Last Assessment & Plan:  BP adequately controlled. No changes in current management. Continue low-salt diet.   Bilateral lower extremity edema 03/06/2017   Formatting of this note might be different from the original. Last Assessment & Plan:  Information about vein specialist given. Recommend compression stocking more often,HCTZ 25 mg added. Wt loss may also help.   Bilateral primary osteoarthritis of knee 05/02/2016   BMI 45.0-49.9, adult (HCC) 05/02/2016   Formatting of this note might be different from the original. Last Assessment & Plan:  She has lost some weight since starting dietary changes. Encouraged to continue a healthful diet and to engage in low impact physical activity. Walking in place is a good option while she is not able to go to the gym.   Chicken pox    Coronary artery disease 03/30/2021   Dyslipidemia 09/27/2018   Gastroesophageal reflux disease without esophagitis 09/27/2018   H/O laparoscopic adjustable gastric banding 09/27/2018   Heart murmur    Hyperlipidemia    Hypertension    IFG (impaired fasting glucose) 09/05/2016   Morbid obesity with BMI of 50.0-59.9, adult (HCC) 05/02/2016   Neuropathy, peripheral 08/09/2017    Formatting of this note might be different from the original. Last Assessment & Plan:  Problem improved with nonpharmacologic treatment. Continue following with chiropractor. Good skin care and feet check periodically.   OSA (obstructive sleep apnea) 05/02/2016   Pain in left knee 09/13/2018   Palpitations 02/25/2021   Paroxysmal atrial fibrillation (HCC) 03/30/2021   Positive fecal occult blood test    Rectal bleeding     Past Surgical History:  Procedure Laterality Date   BIOPSY  06/09/2021   Procedure: BIOPSY;  Surgeon: Federico Rosario BROCKS, MD;  Location: [facility];  Service: Gastroenterology;;   BREAST SURGERY     COLONOSCOPY WITH PROPOFOL  N/A 06/09/2021   Procedure: COLONOSCOPY WITH PROPOFOL ;  Surgeon: Federico Rosario BROCKS, MD;  Location: [facility];  Service: Gastroenterology;  Laterality: N/A;   DG KNEE RIGHT COMPLETE ([facility] HX)     Titanium replacement   POLYPECTOMY  06/09/2021   Procedure: POLYPECTOMY;  Surgeon: Federico Rosario BROCKS, MD;  Location: [facility];  Service: Gastroenterology;;   TONSILLECTOMY AND ADENOIDECTOMY      Social History:  reports that she has quit smoking. Her smoking use included cigarettes. She has a 9 pack-year smoking history. She has never used smokeless tobacco. She reports current alcohol use. She reports that she does not use drugs.  Allergies: Allergies[1]  Family History:  Family History  Problem Relation Age of Onset   Arthritis Mother    Hyperlipidemia Mother    Hypertension Mother    Stroke Mother    Arthritis Father    Hyperlipidemia Father    Hypertension Father  Stroke Father    Breast cancer Neg Hx     Current Medications[2]  Review of Systems:  Negative unless indicated in HPI.   Physical Exam: Vitals:   09/01/24 1004 09/01/24 1022  BP: 130/70 128/70  Pulse: 76   Temp: 97.6 F (36.4 C)   TempSrc: Oral   SpO2: 98%   Weight: (!) 320 lb 14.4 oz (145.6 kg)   Height: 5' 1 (1.549 m)     Body mass index is 60.63  kg/m.   Subsequent Medicare wellness visit   Visit info / Clinical Intake: Medicare Wellness Visit Type:: Subsequent Annual Wellness Visit Persons participating in visit and providing information:: patient Medicare Wellness Visit Mode:: In-person (required for WTM) Interpreter Needed?: No Pre-visit prep was completed: yes AWV questionnaire completed by patient prior to visit?: yes Date:: 08/31/24 Living arrangements:: (!) (Patient-Rptd) lives alone Patient's Overall Health Status Rating: (Patient-Rptd) good Typical amount of pain: (Patient-Rptd) some Does pain affect daily life?: (Patient-Rptd) no Are you currently prescribed opioids?: no  Dietary Habits and Nutritional Risks How many meals a day?: (Patient-Rptd) 2 Eats fruit and vegetables daily?: (Patient-Rptd) yes Most meals are obtained by: (Patient-Rptd) preparing own meals In the last 2 weeks, have you had any of the following?: none Diabetic:: no  Functional Status Activities of Daily Living (to include ambulation/medication): (Patient-Rptd) Independent Ambulation: (Patient-Rptd) Independent with device- listed below Medication Administration: (Patient-Rptd) Independent Home Management (perform basic housework or laundry): (Patient-Rptd) Independent Manage your own finances?: (Patient-Rptd) yes Primary transportation is: (Patient-Rptd) driving Concerns about vision?: no *vision screening is required for WTM* Concerns about hearing?: no  Fall Screening Falls in the past year?: (Patient-Rptd) 0 Number of falls in past year: 0 Was there an injury with Fall?: 0 Fall Risk Category Calculator: 0 Patient Fall Risk Level: Low Fall Risk  Fall Risk Patient at Risk for Falls Due to: No Fall Risks Fall risk Follow up: Falls evaluation completed  Home and Transportation Safety: All rugs have non-skid backing?: (Patient-Rptd) N/A, no rugs All stairs or steps have railings?: (Patient-Rptd) N/A, no stairs Grab bars in the  bathtub or shower?: (Patient-Rptd) yes Have non-skid surface in bathtub or shower?: (Patient-Rptd) yes Good home lighting?: (Patient-Rptd) yes Regular seat belt use?: (Patient-Rptd) yes Hospital stays in the last year:: (Patient-Rptd) no  Cognitive Assessment Difficulty concentrating, remembering, or making decisions? : (Patient-Rptd) no Will 6CIT or Mini Cog be Completed: yes What year is it?: 0 points What month is it?: 0 points Give patient an address phrase to remember (5 components): the cow jumped over the moon About what time is it?: 0 points Count backwards from 20 to 1: 0 points Say the months of the year in reverse: 0 points Repeat the address phrase from earlier: 0 points 6 CIT Score: 0 points  Advance Directives (For Healthcare) Does Patient Have a Medical Advance Directive?: No Would patient like information on creating a medical advance directive?: No - Patient declined  Reviewed/Updated  Reviewed/Updated: Reviewed All (Medical, Surgical, Family, Medications, Allergies, Care Teams, Patient Goals)    Vision Screening   Right eye Left eye Both eyes  Without correction     With correction 20/20 20/20 20/20       Depression/mood:  Flowsheet Row Office Visit from 02/27/2024 in [facility]  PHQ-9 Total Score 1        Counseling: Counseling given: Not Answered     Lab orders based on risk factors: Laboratory update will be reviewed  Screening: Patient provided with a written and personalized 5-10 year screening schedule in the AVS. Health Maintenance  Topic Date Due   Flu Shot  11/25/2024*   Zoster (Shingles) Vaccine (2 of 2) 11/30/2024*   DTaP/Tdap/Td vaccine (1 - Tdap) 09/01/2025*   Pneumococcal Vaccine for age over 71 (2 of 2 - PPSV23, PCV20, or PCV21) 09/01/2025*   Medicare Annual Wellness Visit  09/01/2025   Breast Cancer Screening  04/23/2026   Colon Cancer Screening  06/10/2031   Osteoporosis screening with Bone  Density Scan  Completed   Hepatitis C Screening  Completed   Meningitis B Vaccine  Aged Out   Hepatitis B Vaccine  Discontinued   COVID-19 Vaccine  Discontinued  *Topic was postponed. The date shown is not the original due date.     Provider List Update: Patient Care Team    Relationship Specialty Notifications Start End  Theophilus Andrews, Tully GRADE, MD PCP - General Internal Medicine  04/27/21   Revankar, Jennifer SAUNDERS, MD PCP - Cardiology Cardiology  09/08/22        I have personally reviewed and noted the following in the patients chart:   Medical and social history Use of alcohol, tobacco or illicit drugs  Current medications and supplements Functional ability and status Nutritional status Physical activity Advanced directives List of other physicians Hospitalizations, surgeries, and ER visits in previous 12 months Vitals Screenings to include cognitive, depression, and falls Referrals and appointments  In addition, I have reviewed and discussed with patient certain preventive protocols, quality metrics, and best practice recommendations. A written personalized care plan for preventive services as well as general preventive health recommendations were provided to patient.   Impression and Plan:  Medicare annual wellness visit, subsequent  Body mass index (BMI) 50.0-59.9, adult (HCC) -     Tirzepatide -Weight Management; Inject 2.5 mg into the skin once a week.  Dispense: 2 mL; Refill: 0  Postmenopausal estrogen deficiency -     DG Bone Density; Future  Paroxysmal atrial fibrillation (HCC)    -DEXA scan requested. - Declines all vaccinations despite counseling. - After discussion in regard to obesity she has opted to start Zepbound , will send prescription and follow-up in 3 months.    Tully Theophilus Andrews, MD [facility]     [1] No Known Allergies [2]  Current Outpatient Medications:    amLODipine  (NORVASC ) 10 MG tablet, TAKE 1 TABLET  BY MOUTH DAILY, Disp: 90 tablet, Rfl: 1   atorvastatin  (LIPITOR) 80 MG tablet, Take 1 tablet (80 mg total) by mouth daily., Disp: 90 tablet, Rfl: 1   furosemide  (LASIX ) 20 MG tablet, TAKE 1 TABLET BY MOUTH DAILY, Disp: 90 tablet, Rfl: 1   losartan -hydrochlorothiazide  (HYZAAR) 100-25 MG tablet, TAKE 1 TABLET BY MOUTH DAILY, Disp: 90 tablet, Rfl: 1   OVER THE COUNTER MEDICATION, Apply 1 application topically at bedtime as needed (leg pain). Magnesium calming cream, Disp: , Rfl:    rivaroxaban  (XARELTO ) 20 MG TABS tablet, TAKE 1 TABLET BY MOUTH ONCE DAILY WITH SUPPER, Disp: 90 tablet, Rfl: 1   Coenzyme Q10 ([employer] COQ-10) 200 MG capsule, Take 1 capsule (200 mg total) by mouth daily. (Patient not taking: Reported on 09/01/2024), Disp: 90 capsule, Rfl: 3   tirzepatide  (ZEPBOUND ) 2.5 MG/0.5ML injection vial, Inject 2.5 mg into the skin once a week., Disp: 2 mL, Rfl: 0  "

## 2024-09-04 ENCOUNTER — Other Ambulatory Visit: Payer: Self-pay | Admitting: Cardiology

## 2024-09-04 DIAGNOSIS — I480 Paroxysmal atrial fibrillation: Secondary | ICD-10-CM

## 2024-09-17 ENCOUNTER — Telehealth: Payer: Self-pay

## 2024-09-17 DIAGNOSIS — Z6841 Body Mass Index (BMI) 40.0 and over, adult: Secondary | ICD-10-CM

## 2024-09-17 NOTE — Telephone Encounter (Signed)
 Copied from CRM #[id number]. Topic: Clinical - Medication Refill >> Sep 17, 2024 11:03 AM [name] wrote: Medication: tirzepatide  (ZEPBOUND ) 2.5 MG/0.5ML injection vial  Has the patient contacted their pharmacy? No (Agent: If no, request that the patient contact the pharmacy for the refill. If patient does not wish to contact the pharmacy document the reason why and proceed with request.) (Agent: If yes, when and what did the pharmacy advise?)  This is the patient's preferred pharmacy:   [facility] [place], [place] - [address] [phone] [place] [address] Phone: [phone] Fax: [phone]  Is this the correct pharmacy for this prescription? Yes If no, delete pharmacy and type the correct one.   Has the prescription been filled recently? Yes  Is the patient out of the medication? No  Has the patient been seen for an appointment in the last year OR does the patient have an upcoming appointment? Yes  Can we respond through MyChart? Yes  Agent: Please be advised that Rx refills may take up to 3 business days. We ask that you follow-up with your pharmacy.

## 2024-09-17 NOTE — Telephone Encounter (Signed)
 Copied from CRM #[id number]. Topic: Clinical - Medication Question >> Sep 17, 2024 11:05 AM [name] wrote: Reason for CRM: Pt just requested a refill of the tirzepatide  (ZEPBOUND ) 2.5 MG/0.5ML injection vial. She says she is still having cravings and would like an increase if possible

## 2024-09-18 MED ORDER — TIRZEPATIDE-WEIGHT MANAGEMENT 5 MG/0.5ML ~~LOC~~ SOLN: 5.0000 mg | SUBCUTANEOUS | 0 refills | Status: AC

## 2024-09-18 MED ORDER — TIRZEPATIDE-WEIGHT MANAGEMENT 2.5 MG/0.5ML ~~LOC~~ SOLN: 2.5000 mg | SUBCUTANEOUS | 0 refills | Status: DC

## 2024-09-18 NOTE — Addendum Note (Signed)
 Addended by: KATHRYNE MILLMAN B on: 09/18/2024 08:33 AM   Modules accepted: Orders

## 2024-09-18 NOTE — Telephone Encounter (Signed)
 Refill sent

## 2024-09-18 NOTE — Addendum Note (Signed)
 Addended by: KATHRYNE MILLMAN B on: 09/18/2024 08:32 AM   Modules accepted: Orders

## 2024-12-03 ENCOUNTER — Ambulatory Visit: Admitting: Internal Medicine
